# Patient Record
Sex: Male | Born: 1990 | Race: White | Hispanic: No | Marital: Married | Smoking: Never smoker
Health system: Southern US, Community
[De-identification: ages and names within clinical notes are randomized; demographics above are authoritative.]

## PROBLEM LIST (undated history)

## (undated) DIAGNOSIS — R0789 Other chest pain: Secondary | ICD-10-CM

## (undated) DIAGNOSIS — R000 Tachycardia, unspecified: Secondary | ICD-10-CM

## (undated) DIAGNOSIS — I499 Cardiac arrhythmia, unspecified: Secondary | ICD-10-CM

## (undated) DIAGNOSIS — R42 Dizziness and giddiness: Secondary | ICD-10-CM

## (undated) DIAGNOSIS — R079 Chest pain, unspecified: Secondary | ICD-10-CM

## (undated) HISTORY — DX: Chest pain, unspecified: R07.9

## (undated) HISTORY — DX: Tachycardia, unspecified: R00.0

## (undated) HISTORY — DX: Dizziness and giddiness: R42

## (undated) HISTORY — DX: Cardiac arrhythmia, unspecified: I49.9

## (undated) HISTORY — DX: Other chest pain: R07.89

---

## 2015-11-01 ENCOUNTER — Emergency Department (HOSPITAL_COMMUNITY): Payer: 59

## 2015-11-01 ENCOUNTER — Encounter (HOSPITAL_COMMUNITY): Payer: Self-pay | Admitting: Emergency Medicine

## 2015-11-01 ENCOUNTER — Emergency Department (HOSPITAL_COMMUNITY)
Admission: EM | Admit: 2015-11-01 | Discharge: 2015-11-02 | Disposition: A | Discharge status: Home / Self Care | Payer: 59 | Attending: Emergency Medicine | Admitting: Emergency Medicine

## 2015-11-01 DIAGNOSIS — I4891 Unspecified atrial fibrillation: Secondary | ICD-10-CM | POA: Diagnosis not present

## 2015-11-01 DIAGNOSIS — Z7901 Long term (current) use of anticoagulants: Secondary | ICD-10-CM | POA: Insufficient documentation

## 2015-11-01 DIAGNOSIS — R079 Chest pain, unspecified: Secondary | ICD-10-CM | POA: Diagnosis present

## 2015-11-01 HISTORY — PX: CARDIOVERSION: SHX1299

## 2015-11-01 LAB — CBC
HCT: 41.2 % (ref 39.0–52.0)
Hemoglobin: 14.8 g/dL (ref 13.0–17.0)
MCH: 30.4 pg (ref 26.0–34.0)
MCHC: 35.9 g/dL (ref 30.0–36.0)
MCV: 84.6 fL (ref 78.0–100.0)
PLATELETS: 249 10*3/uL (ref 150–400)
RBC: 4.87 MIL/uL (ref 4.22–5.81)
RDW: 11.9 % (ref 11.5–15.5)
WBC: 5.7 10*3/uL (ref 4.0–10.5)

## 2015-11-01 LAB — BASIC METABOLIC PANEL
Anion gap: 7 (ref 5–15)
BUN: 11 mg/dL (ref 6–20)
CHLORIDE: 108 mmol/L (ref 101–111)
CO2: 23 mmol/L (ref 22–32)
CREATININE: 1 mg/dL (ref 0.61–1.24)
Calcium: 9.4 mg/dL (ref 8.9–10.3)
Glucose, Bld: 108 mg/dL — ABNORMAL HIGH (ref 65–99)
POTASSIUM: 4 mmol/L (ref 3.5–5.1)
SODIUM: 138 mmol/L (ref 135–145)

## 2015-11-01 LAB — I-STAT TROPONIN, ED
Troponin i, poc: 0 ng/mL (ref 0.00–0.08)
Troponin i, poc: 0 ng/mL (ref 0.00–0.08)

## 2015-11-01 LAB — T4, FREE: FREE T4: 0.8 ng/dL (ref 0.61–1.12)

## 2015-11-01 LAB — TSH: TSH: 2.966 u[IU]/mL (ref 0.350–4.500)

## 2015-11-01 MED ORDER — DILTIAZEM LOAD VIA INFUSION
20.0000 mg | Freq: Once | INTRAVENOUS | Status: AC
  Administered 2015-11-01: 20 mg via INTRAVENOUS

## 2015-11-01 MED ORDER — SODIUM CHLORIDE 0.9 % IV BOLUS (SEPSIS)
1000.0000 mL | Freq: Once | INTRAVENOUS | Status: AC
  Administered 2015-11-01: 1000 mL via INTRAVENOUS

## 2015-11-01 MED ORDER — PROPOFOL 10 MG/ML IV BOLUS
0.5000 mg/kg | INTRAVENOUS | Status: DC | PRN
  Administered 2015-11-01: 60 mg via INTRAVENOUS

## 2015-11-01 MED ORDER — RIVAROXABAN 20 MG PO TABS: 20.0000 mg | ORAL_TABLET | Freq: Every day | ORAL | Status: DC

## 2015-11-01 MED ORDER — RIVAROXABAN 20 MG PO TABS
20.0000 mg | ORAL_TABLET | Freq: Every day | ORAL | Status: DC
  Administered 2015-11-02: 20 mg via ORAL
  Filled 2015-11-01: qty 1

## 2015-11-01 MED ORDER — DEXTROSE 5 % IV SOLN
5.0000 mg/h | INTRAVENOUS | Status: DC
  Administered 2015-11-01: 5 mg/h via INTRAVENOUS

## 2015-11-01 MED ORDER — PROPOFOL 10 MG/ML IV BOLUS
INTRAVENOUS | Status: AC | PRN
  Administered 2015-11-01: 60 mg via INTRAVENOUS

## 2015-11-01 MED ORDER — PROPOFOL 10 MG/ML IV BOLUS
INTRAVENOUS | Status: AC
  Filled 2015-11-01: qty 20

## 2015-11-01 MED ORDER — DILTIAZEM HCL 100 MG IV SOLR
INTRAVENOUS | Status: AC
  Filled 2015-11-01: qty 100

## 2015-11-01 MED ORDER — PROPOFOL 10 MG/ML IV BOLUS: 0.5000 mg/kg | INTRAVENOUS | Status: DC | PRN

## 2015-11-01 NOTE — ED Notes (Signed)
Patient arrives with complaint of tachycardia, dizziness, chest pain, lightheadedness. HR in triage up to 190. EKG shows A-fib. Denies history. 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 Typical baseline HR in the 60-70s.

## 2015-11-01 NOTE — ED Provider Notes (Signed)
CSN: [id number]     Arrival date & time 11/01/15  1952 History  By signing my name below, I, Tanda Rockers, attest that this documentation has been prepared under the direction and in the presence of Linwood Dibbles, MD. Electronically Signed: Tanda Rockers, ED Scribe. 11/01/2015. 8:36 PM.   Chief Complaint  Patient presents with  . Tachycardia  . Dizziness  . Chest Pain   The history is provided by the patient. No language interpreter was used.    HPI Comments: Scott Booth is a 25 y.o. male who presents to the Emergency Department complaining of sudden onset, constant, waxing and waning, chest heaviness that began earlier tonight. Pt reports that he was sitting on the couch after dinner when he began having the heaviness. He also reports feelings of dizziness and his head "rushing." Pt's girlfriend is a Engineer, civil (consulting) and checked pt's heart rate, reporting skipping beats. Pt has never had symptoms like this in the past. No hx cardiac issues. No energy drinks or caffeine. He did have 1 margarita with dinner and  typically only drinks 1 beer per weekend. FHx cardiac issues. Denies any other associated symptoms.   History reviewed. No pertinent past medical history. History reviewed. No pertinent past surgical history. History reviewed. No pertinent family history. Social History  Substance Use Topics  . Smoking status: Never Smoker   . Smokeless tobacco: None  . Alcohol Use: No    Review of Systems  Cardiovascular: Positive for chest pain.  Neurological: Positive for dizziness and light-headedness.  All other systems reviewed and are negative.  Allergies  Azithromycin and Other  Home Medications   Prior to Admission medications   Medication Sig Start Date End Date Taking? Authorizing Provider  rivaroxaban (XARELTO) 20 MG TABS tablet Take 1 tablet (20 mg total) by mouth daily with supper. 11/01/15   Linwood Dibbles, MD   BP 121/84 mmHg  Pulse 86  Temp(Src) 98.2 F (36.8 C) (Oral)  Resp 16  Wt  115.214 kg  SpO2 100%   Physical Exam  Constitutional: He appears well-developed and well-nourished. No distress.  HENT:  Head: Normocephalic and atraumatic.  Right Ear: External ear normal.  Left Ear: External ear normal.  Eyes: Conjunctivae are normal. Right eye exhibits no discharge. Left eye exhibits no discharge. No scleral icterus.  Neck: Neck supple. No tracheal deviation present.  Cardiovascular: Intact distal pulses.  An irregularly irregular rhythm present. Tachycardia present.   Pulmonary/Chest: Effort normal and breath sounds normal. No stridor. No respiratory distress. He has no wheezes. He has no rales.  Abdominal: Soft. Bowel sounds are normal. He exhibits no distension. There is no tenderness. There is no rebound and no guarding.  Musculoskeletal: He exhibits no edema or tenderness.  Neurological: He is alert. He has normal strength. No cranial nerve deficit (no facial droop, extraocular movements intact, no slurred speech) or sensory deficit. He exhibits normal muscle tone. He displays no seizure activity. Coordination normal.  Skin: Skin is warm and dry. No rash noted.  Psychiatric: He has a normal mood and affect.  Nursing note and vitals reviewed.   ED Course  .Cardioversion Date/Time: 11/01/2015 11:02 PM Performed by: Linwood Dibbles Authorized by: Linwood Dibbles Consent: Verbal consent obtained. Written consent obtained. Risks and benefits: risks, benefits and alternatives were discussed Consent given by: patient Patient sedated: yes Cardioversion basis: elective Pre-procedure rhythm: atrial fibrillation Patient position: patient was placed in a supine position Chest area: chest area exposed Electrodes: pads Electrodes placed: anterior-posterior Number of attempts:  1 Attempt 1 mode: synchronous Attempt 1 waveform: biphasic Attempt 1 shock (in Joules): 150 Attempt 1 outcome: conversion to normal sinus rhythm  .Sedation Date/Time: 11/01/2015 11:02 PM Performed by:  Linwood Dibbles Authorized by: Linwood Dibbles  Consent:    Consent obtained:  Written   Consent given by:  Patient Universal protocol:    Procedure explained and questions answered to patient or proxy's satisfaction: yes     Relevant documents present and verified: yes     Test results available and properly labeled: yes     Imaging studies available: yes     Required blood products, implants, devices, and special equipment available: yes     Site/side marked: yes     Immediately prior to procedure a time out was called: yes   Indications:    Sedation purpose:  Cardioversion   Procedure necessitating sedation performed by:  Physician performing sedation   Intended level of sedation:  Deep Pre-sedation assessment:    Time since last food or drink:  4   ASA classification: class 1 - normal, healthy patient     Neck mobility: normal     Mouth opening:  3 or more finger widths   Thyromental distance:  3 finger widths   Mallampati score:  I - soft palate, uvula, fauces, pillars visible   Pre-sedation assessments completed and reviewed: airway patency, cardiovascular function, hydration status, mental status, nausea/vomiting, pain level, respiratory function and temperature     Pre-sedation assessment completed:  11/01/2015 10:30 PM Immediate pre-procedure details:    Reassessment: Patient reassessed immediately prior to procedure     Reviewed: vital signs, relevant labs/tests and NPO status     Verified: bag valve mask available, emergency equipment available, intubation equipment available, IV patency confirmed, oxygen available, reversal medications available and suction available   Procedure details (see MAR for exact dosages):    Sedation start time:  11/01/2015 10:50 PM Post-procedure details:    Post-sedation assessment completed:  11/01/2015 10:50 PM   Attendance: Constant attendance by certified staff until patient recovered     Post-sedation assessments completed and reviewed: airway  patency, cardiovascular function, hydration status, mental status, nausea/vomiting, pain level, respiratory function and temperature     Patient is stable for discharge or admission: Yes     Patient tolerance:  Tolerated well, no immediate complications  (including critical care time)  DIAGNOSTIC STUDIES: Oxygen Saturation is 98% on RA, normal by my interpretation.    COORDINATION OF CARE: 8:33 PM-Discussed treatment plan which includes BMP, CBC, troponin, TSH, T4, and Troponin with pt at bedside and pt agreed to plan.   Labs Review Labs Reviewed  BASIC METABOLIC PANEL - Abnormal; Notable for the following:    Glucose, Bld 108 (*)    All other components within normal limits  CBC  TSH  T4, FREE  I-STAT TROPOININ, ED  Rosezena Sensor, ED    Imaging Review Dg Chest Port 1 View  11/01/2015  CLINICAL DATA:  Chest heaviness.  Atrial fibrillation. EXAM: PORTABLE CHEST 1 VIEW COMPARISON:  None. FINDINGS: Cardiomediastinal silhouette is normal. Mediastinal contours appear intact. There is no evidence of focal airspace consolidation, pleural effusion or pneumothorax. Low lung volumes causing exaggeration of the interstitial markings. Osseous structures are without acute abnormality. Soft tissues are grossly normal. IMPRESSION: Low lung volumes causing exaggeration of the interstitial markings, otherwise no evidence of radiographically apparent acute cardiopulmonary disease. Electronically Signed   By: Ted Mcalpine M.D.   On: 11/01/2015 20:42   I  have personally reviewed and evaluated these images and lab results as part of my medical decision-making.   EKG Interpretation   Date/Time:  Friday November 01 2015 22:54:37 EDT Ventricular Rate:  80 PR Interval:  168 QRS Duration: 97 QT Interval:  336 QTC Calculation: 387 R Axis:   45 Text Interpretation:  Sinus rhythm ST elevation suggests acute  pericarditis atrial fibrillation resolved since last tracing Confirmed by  Laurine Kuyper  MD-J, Elianna Windom  ([id number]) on 11/01/2015 11:24:44 PM    EKG Interpretation  Date/Time:  Friday November 01 2015 22:54:37 EDT Ventricular Rate:  80 PR Interval:  168 QRS Duration: 97 QT Interval:  336 QTC Calculation: 387 R Axis:   45 Text Interpretation:  Sinus rhythm ST elevation suggests acute pericarditis atrial fibrillation resolved since last tracing Confirmed by Averiana Clouatre  MD-J, Tamina Cyphers ([id number]) on 11/01/2015 11:24:44 PM        MDM   Final diagnoses:  Atrial fibrillation, rapid (HCC)    ChadsVasc =0.  I discussed cardioversion with the patient .  Consulted with Dr Charlestine NightMeans who agrees.  Pt successfully carioverted in the ED.  Will dc home on xarelto.  Dose given in the ED.   Follow up in a fib clinic  I personally performed the services described in this documentation, which was scribed in my presence.  The recorded information has been reviewed and is accurate.      Linwood DibblesJon Milton Streicher, MD 11/01/15 [id number]

## 2015-11-01 NOTE — Significant Event (Signed)
Discussed case with ED attending Lynelle Doctor([place]).  25 year old with acute onset symptoms of chest pain/palpitations tonight, without evidence for prior symptoms to suggest AF prior to tonight. CHADS2VASC 0.  Rates currently controlled, in AFib.   Currently stable, good candidate for Afib protocol.  Agree with electrical cardioversion and if successful, 4 weeks of DOAC if no bleeding risks with follow up in Afib clinic ([id number])

## 2015-11-01 NOTE — Discharge Instructions (Signed)
Atrial Fibrillation °Atrial fibrillation is a type of heartbeat that is irregular or fast (rapid). If you have this condition, your heart keeps quivering in a weird (chaotic) way. This condition can make it so your heart cannot pump blood normally. Having this condition gives a person more risk for stroke, heart failure, and other heart problems. There are different types of atrial fibrillation. Talk with your doctor to learn about the type that you have. °HOME CARE °· Take over-the-counter and prescription medicines only as told by your doctor. °· If your doctor prescribed a blood-thinning medicine, take it exactly as told. Taking too much of it can cause bleeding. If you do not take enough of it, you will not have the protection that you need against stroke and other problems. °· Do not use any tobacco products. These include cigarettes, chewing tobacco, and e-cigarettes. If you need help quitting, ask your doctor. °· If you have apnea (obstructive sleep apnea), manage it as told by your doctor. °· Do not drink alcohol. °· Do not drink beverages that have caffeine. These include coffee, soda, and tea. °· Maintain a healthy weight. Do not use diet pills unless your doctor says they are safe for you. Diet pills may make heart problems worse. °· Follow diet instructions as told by your doctor. °· Exercise regularly as told by your doctor. °· Keep all follow-up visits as told by your doctor. This is important. °GET HELP IF: °· You notice a change in the speed, rhythm, or strength of your heartbeat. °· You are taking a blood-thinning medicine and you notice more bruising. °· You get tired more easily when you move or exercise. °GET HELP RIGHT AWAY IF: °· You have pain in your chest or your belly (abdomen). °· You have sweating or weakness. °· You feel sick to your stomach (nauseous). °· You notice blood in your throw up (vomit), poop (stool), or pee (urine). °· You are short of breath. °· You suddenly have swollen feet  and ankles. °· You feel dizzy. °· Your suddenly get weak or numb in your face, arms, or legs, especially if it happens on one side of your body. °· You have trouble talking, trouble understanding, or both. °· Your face or your eyelid droops on one side. °These symptoms may be an emergency. Do not wait to see if the symptoms will go away. Get medical help right away. Call your local emergency services (911 in the [place] Do not drive yourself to the hospital. °  °This information is not intended to replace advice given to you by your health care provider. Make sure you discuss any questions you have with your health care provider. °  °Document Released: 02/11/2008 Document Revised: 01/23/2015 Document Reviewed: 08/29/2014 °Elsevier Interactive Patient Education ©2016 [employer] ° °

## 2015-11-02 NOTE — ED Notes (Signed)
Patient Alert and oriented X4. Stable and ambulatory. Patient verbalized understanding of the discharge instructions.  Patient belongings were taken by the patient. Patient was sedated and fully back to baseline before discharge

## 2015-11-05 DIAGNOSIS — R079 Chest pain, unspecified: Secondary | ICD-10-CM | POA: Insufficient documentation

## 2015-11-05 DIAGNOSIS — R0789 Other chest pain: Secondary | ICD-10-CM | POA: Insufficient documentation

## 2015-11-05 DIAGNOSIS — R42 Dizziness and giddiness: Secondary | ICD-10-CM

## 2015-11-05 DIAGNOSIS — R000 Tachycardia, unspecified: Secondary | ICD-10-CM

## 2015-11-05 DIAGNOSIS — I309 Acute pericarditis, unspecified: Secondary | ICD-10-CM | POA: Insufficient documentation

## 2015-11-05 DIAGNOSIS — I499 Cardiac arrhythmia, unspecified: Secondary | ICD-10-CM | POA: Insufficient documentation

## 2015-11-05 DIAGNOSIS — I4891 Unspecified atrial fibrillation: Secondary | ICD-10-CM

## 2015-11-15 ENCOUNTER — Encounter: Payer: Self-pay | Admitting: *Deleted

## 2015-11-18 ENCOUNTER — Encounter: Payer: 59 | Admitting: Cardiology

## 2015-11-18 NOTE — Progress Notes (Signed)
This encounter was created in error - please disregard.

## 2015-11-26 NOTE — Progress Notes (Signed)
Electrophysiology Office Note   Date:  11/28/2015   ID:  Scott Booth, DOB 23-Aug-1990, MRN [id number]  PCP:  No PCP Per Patient Primary Electrophysiologist:  Will Jorja LoaMartin Camnitz, MD    Chief Complaint  Patient presents with  . New Patient (Initial Visit)    post hospital  . Atrial Fibrillation     History of Present Illness: Scott Booth is a 25 y.o. male who presents today for electrophysiology evaluation.   Presented to the ER with sudden onset, constant, waxing and waning, chest heaviness on 11/01/15. Pt was sitting on the couch after dinner when he began having the heaviness. He also reports feelings of dizziness and his head "rushing." Pt's girlfriend is a Engineer, civil (consulting)nurse and checked pt's heart rate, reported skipping beats. Pt has never had symptoms like this in the past. No hx cardiac issues. No energy drinks or caffeine. He did have 1 margarita with dinner and typically only drinks 1 beer per weekend. FHx cardiac issues. Denies any other associated symptoms.  Patient was cardioverted in the ER. Since that time, he is felt well without   palpitations.He has been in endurance athlete in the past, swimming distance in college. He has done up to 5K swims in open water.  Today, he denies symptoms of palpitations, chest pain, shortness of breath, orthopnea, PND, lower extremity edema, claudication, dizziness, presyncope, syncope, bleeding, or neurologic sequela. The patient is tolerating medications without difficulties and is otherwise without complaint today.    Past Medical History  Diagnosis Date  . Arrhythmia     AFIB  . Tachycardia   . Dizziness   . Chest pain   . Light headedness   . Chest heaviness   . Acute pericarditis     WITH ATRIAL FIB   Past Surgical History  Procedure Laterality Date  . Cardioversion  11/01/15     Current Outpatient Prescriptions  Medication Sig Dispense Refill  . rivaroxaban (XARELTO) 20 MG TABS tablet Take 1 tablet (20 mg total) by mouth daily  with supper. 30 tablet 0   No current facility-administered medications for this visit.    Allergies:   Azithromycin and Other   Social History:  The patient  reports that he has never smoked. He does not have any smokeless tobacco history on file. He reports that he does not drink alcohol or use illicit drugs.   Family History:  The patient's family history includes Stroke in his maternal grandfather.    ROS:  Please see the history of present illness.   Otherwise, review of systems is positive for none.   All other systems are reviewed and negative.    PHYSICAL EXAM: VS:  BP 124/86 mmHg  Pulse 82  Ht 6\' 4"  (1.93 m)  Wt 259 lb 6.4 oz (117.663 kg)  BMI 31.59 kg/m2 , BMI Body mass index is 31.59 kg/(m^2). GEN: Well nourished, well developed, in no acute distress HEENT: normal Neck: no JVD, carotid bruits, or masses Cardiac: RRR; no murmurs, rubs, or gallops,no edema  Respiratory:  clear to auscultation bilaterally, normal work of breathing GI: soft, nontender, nondistended, + BS MS: no deformity or atrophy Skin: warm and dry Neuro:  Strength and sensation are intact Psych: euthymic mood, full affect  EKG:  EKG is not ordered today.  Recent Labs: 11/01/2015: BUN 11; Creatinine, Ser 1.00; Hemoglobin 14.8; Platelets 249; Potassium 4.0; Sodium 138; TSH 2.966    Lipid Panel  No results found for: CHOL, TRIG, HDL, CHOLHDL, VLDL, LDLCALC, LDLDIRECT  Wt Readings from Last 3 Encounters:  11/27/15 259 lb 6.4 oz (117.663 kg)  11/01/15 254 lb (115.214 kg)      Other studies Reviewed: Additional studies/ records that were reviewed today include: Epic notes   ASSESSMENT AND PLAN:  1.  Atrial fibrillation: Had cardioversion in the ER and was placed on Xarelto.  He has not had recurrence since that time. He will likely be able to come off the Xarelto, but he is too close to the time of his cardioversion. To further evaluate, we'll get an echocardiogram to determine if there is a  cardiac cause for his atrial fibrillation. 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 There is evidence of endurance athletes having a higher incidence of atrial fibrillation.     Current medicines are reviewed at length with the patient today.   The patient does not have concerns regarding his medicines.  The following changes were made today:  none  Labs/ tests ordered today include:  Orders Placed This Encounter  Procedures  . ECHOCARDIOGRAM COMPLETE     Disposition:   FU with Will Camnitz 1 year  Signed, Will Jorja Loa, MD  11/28/2015 9:51 AM     [facility] [address] [place] [address] [phone] (office) [phone] (fax)

## 2015-11-27 ENCOUNTER — Encounter: Payer: Self-pay | Admitting: Cardiology

## 2015-11-27 ENCOUNTER — Encounter (INDEPENDENT_AMBULATORY_CARE_PROVIDER_SITE_OTHER): Payer: Self-pay

## 2015-11-27 ENCOUNTER — Ambulatory Visit (INDEPENDENT_AMBULATORY_CARE_PROVIDER_SITE_OTHER): Payer: 59 | Admitting: Cardiology

## 2015-11-27 VITALS — BP 124/86 | HR 82 | Ht 76.0 in | Wt 259.4 lb

## 2015-11-27 DIAGNOSIS — I480 Paroxysmal atrial fibrillation: Secondary | ICD-10-CM | POA: Diagnosis not present

## 2015-11-27 NOTE — Patient Instructions (Addendum)
Medication Instructions:  Your physician recommends that you continue on your current medications as directed. Please refer to the Current Medication list given to you today.  Labwork: None ordered  Testing/Procedures: Your physician has requested that you have an echocardiogram. Echocardiography is a painless test that uses sound waves to create images of your heart. It provides your doctor with information about the size and shape of your heart and how well your heart's chambers and valves are working. This procedure takes approximately one hour. There are no restrictions for this procedure.  Follow-Up: Your physician wants you to follow-up in: 1 year with Dr. Elberta Fortisamnitz.  You will receive a reminder letter in the mail two months in advance. If you don't receive a letter, please call our office to schedule the follow-up appointment.  If you need a refill on your cardiac medications before your next appointment, please call your pharmacy.  Thank you for choosing [facility]!!   Dory HornSherri Yomira Flitton, RN [phone]

## 2015-12-16 ENCOUNTER — Encounter (INDEPENDENT_AMBULATORY_CARE_PROVIDER_SITE_OTHER): Payer: Self-pay

## 2015-12-16 ENCOUNTER — Other Ambulatory Visit (HOSPITAL_COMMUNITY): Payer: Self-pay

## 2015-12-16 ENCOUNTER — Ambulatory Visit (HOSPITAL_COMMUNITY): Payer: 59 | Attending: Cardiovascular Disease

## 2015-12-16 DIAGNOSIS — I517 Cardiomegaly: Secondary | ICD-10-CM | POA: Insufficient documentation

## 2015-12-16 DIAGNOSIS — I480 Paroxysmal atrial fibrillation: Secondary | ICD-10-CM | POA: Diagnosis not present

## 2015-12-16 DIAGNOSIS — I4891 Unspecified atrial fibrillation: Secondary | ICD-10-CM | POA: Diagnosis present

## 2016-01-07 ENCOUNTER — Telehealth: Payer: Self-pay | Admitting: *Deleted

## 2016-01-07 NOTE — Telephone Encounter (Signed)
Notes Recorded by Sharin GravePamela J Fleming, RN on 12/23/2015 at 4:53 PM EDT Notified pt per Dr Elberta Fortisamnitz ok to stop anticoagulation. He states undesrtanding ------  Notes Recorded by Baird LyonsSherri L Marky Buresh, RN on 12/20/2015 at 4:05 PM EDT Reviewed results with patient who verbalized understanding. Patient explains that he is on a blood thinner and states Dr. Elberta Fortisamnitz advised that he will review echo first and then decide on continuation of blood thinner. [facility] for [facility]. Patient understands we will call him back once reviewed. ------

## 2016-01-07 NOTE — Telephone Encounter (Signed)
Stopping anticoagulation per note below.

## 2018-01-17 ENCOUNTER — Emergency Department
Admission: EM | Admit: 2018-01-17 | Discharge: 2018-01-17 | Disposition: A | Discharge status: Home / Self Care | Payer: 59 | Attending: Emergency Medicine | Admitting: Emergency Medicine

## 2018-01-17 ENCOUNTER — Encounter: Payer: Self-pay | Admitting: Emergency Medicine

## 2018-01-17 ENCOUNTER — Other Ambulatory Visit: Payer: Self-pay

## 2018-01-17 ENCOUNTER — Emergency Department: Payer: 59

## 2018-01-17 DIAGNOSIS — K529 Noninfective gastroenteritis and colitis, unspecified: Secondary | ICD-10-CM

## 2018-01-17 DIAGNOSIS — K6389 Other specified diseases of intestine: Secondary | ICD-10-CM

## 2018-01-17 DIAGNOSIS — R1032 Left lower quadrant pain: Secondary | ICD-10-CM | POA: Diagnosis present

## 2018-01-17 DIAGNOSIS — K659 Peritonitis, unspecified: Secondary | ICD-10-CM | POA: Diagnosis not present

## 2018-01-17 LAB — URINALYSIS, COMPLETE (UACMP) WITH MICROSCOPIC
Bacteria, UA: NONE SEEN
Bilirubin Urine: NEGATIVE
Glucose, UA: NEGATIVE mg/dL
Hgb urine dipstick: NEGATIVE
KETONES UR: NEGATIVE mg/dL
LEUKOCYTES UA: NEGATIVE
Nitrite: NEGATIVE
PH: 5 (ref 5.0–8.0)
Protein, ur: NEGATIVE mg/dL
SPECIFIC GRAVITY, URINE: 1.02 (ref 1.005–1.030)
SQUAMOUS EPITHELIAL / LPF: NONE SEEN (ref 0–5)

## 2018-01-17 LAB — COMPREHENSIVE METABOLIC PANEL
ALBUMIN: 5.1 g/dL — AB (ref 3.5–5.0)
ALT: 45 U/L — AB (ref 0–44)
AST: 25 U/L (ref 15–41)
Alkaline Phosphatase: 55 U/L (ref 38–126)
Anion gap: 6 (ref 5–15)
BUN: 15 mg/dL (ref 6–20)
CHLORIDE: 105 mmol/L (ref 98–111)
CO2: 28 mmol/L (ref 22–32)
CREATININE: 0.99 mg/dL (ref 0.61–1.24)
Calcium: 9.8 mg/dL (ref 8.9–10.3)
GFR calc Af Amer: >60 mL/min (ref >60)
GFR calc non Af Amer: >60 mL/min (ref >60)
GLUCOSE: 96 mg/dL (ref 70–99)
POTASSIUM: 4.6 mmol/L (ref 3.5–5.1)
Sodium: 139 mmol/L (ref 135–145)
Total Bilirubin: 1 mg/dL (ref 0.3–1.2)
Total Protein: 7.3 g/dL (ref 6.5–8.1)

## 2018-01-17 LAB — CBC
HEMATOCRIT: 44.2 % (ref 40.0–52.0)
Hemoglobin: 15.4 g/dL (ref 13.0–18.0)
MCH: 31.5 pg (ref 26.0–34.0)
MCHC: 34.8 g/dL (ref 32.0–36.0)
MCV: 90.6 fL (ref 80.0–100.0)
PLATELETS: 252 10*3/uL (ref 150–440)
RBC: 4.88 MIL/uL (ref 4.40–5.90)
RDW: 12.5 % (ref 11.5–14.5)
WBC: 6.8 10*3/uL (ref 3.8–10.6)

## 2018-01-17 LAB — LIPASE, BLOOD: LIPASE: 24 U/L (ref 11–51)

## 2018-01-17 MED ORDER — OXYCODONE-ACETAMINOPHEN 5-325 MG PO TABS: 1.0000 | ORAL_TABLET | Freq: Two times a day (BID) | ORAL | 0 refills | Status: DC | PRN

## 2018-01-17 MED ORDER — MORPHINE SULFATE (PF) 4 MG/ML IV SOLN
4.0000 mg | Freq: Once | INTRAVENOUS | Status: AC
  Administered 2018-01-17: 4 mg via INTRAVENOUS

## 2018-01-17 MED ORDER — IBUPROFEN 800 MG PO TABS: 800.0000 mg | ORAL_TABLET | Freq: Three times a day (TID) | ORAL | 0 refills | Status: DC | PRN

## 2018-01-17 MED ORDER — IOPAMIDOL (ISOVUE-300) INJECTION 61%
100.0000 mL | Freq: Once | INTRAVENOUS | Status: AC | PRN
  Administered 2018-01-17: 100 mL via INTRAVENOUS

## 2018-01-17 MED ORDER — MORPHINE SULFATE (PF) 4 MG/ML IV SOLN
INTRAVENOUS | Status: AC
  Filled 2018-01-17: qty 1

## 2018-01-17 NOTE — ED Triage Notes (Signed)
Here for LLQ abdominal pain X 2 days. Ambulatory. NAD.  Sent from urgent care.  Denies NVD, fevers.

## 2018-01-17 NOTE — ED Notes (Signed)
Patient transported to CT 

## 2018-01-17 NOTE — ED Notes (Signed)
This RN confirmed patient has ride to go home. Pt verbalized that Wife would be taking him home.

## 2018-01-17 NOTE — ED Provider Notes (Signed)
[facility] Emergency Department Provider Note       Time seen: ----------------------------------------- 12:07 PM on 01/17/2018 -----------------------------------------   I have reviewed the triage vital signs and the nursing notes.  HISTORY   Chief Complaint Abdominal Pain    HPI Scott Booth is a 27 y.o. male with a history of pericarditis and arrhythmia who presents to the ED for left lower quadrant pain for the past 2 days.  Patient was sent from an urgent care due to the degree of tenderness he is having in his left lower quadrant.  Patient states 2 weeks ago he had some dull pain that resolved and then this started again Saturday at its worst.  It was sharp and in the left lower quadrant.  He denies fevers, chills, vomiting or diarrhea.  Past Medical History:  Diagnosis Date  . Acute pericarditis    WITH ATRIAL FIB  . Arrhythmia    AFIB  . Chest heaviness   . Chest pain   . Dizziness   . Light headedness   . Tachycardia     Patient Active Problem List   Diagnosis Date Noted  . Arrhythmia   . Tachycardia   . Dizziness   . Chest pain   . Light headedness   . Chest heaviness   . Acute pericarditis     Past Surgical History:  Procedure Laterality Date  . CARDIOVERSION  11/01/15    Allergies Azithromycin and Other  Social History Social History   Tobacco Use  . Smoking status: Never Smoker  Substance Use Topics  . Alcohol use: No  . Drug use: No   Review of Systems Constitutional: Negative for fever. Cardiovascular: Negative for chest pain. Respiratory: Negative for shortness of breath. Gastrointestinal: Positive for abdominal pain Musculoskeletal: Negative for back pain. Skin: Negative for rash. Neurological: Negative for headaches, focal weakness or numbness.  All systems negative/normal/unremarkable except as stated in the HPI  ____________________________________________   PHYSICAL EXAM:  VITAL SIGNS: ED  Triage Vitals  Enc Vitals Group     BP 01/17/18 1124 111/77     Pulse Rate 01/17/18 1124 72     Resp 01/17/18 1124 18     Temp 01/17/18 1124 98.2 F (36.8 C)     Temp Source 01/17/18 1124 Oral     SpO2 01/17/18 1124 100 %     Weight 01/17/18 1121 260 lb (117.9 kg)     Height 01/17/18 1121 6\' 4"  (1.93 m)     Head Circumference --      Peak Flow --      Pain Score 01/17/18 1121 9     Pain Loc --      Pain Edu? --      Excl. in GC? --    Constitutional: Alert and oriented. Well appearing and in no distress. Eyes: Conjunctivae are normal. Normal extraocular movements. ENT   Head: Normocephalic and atraumatic.   Nose: No congestion/rhinnorhea.   Mouth/Throat: Mucous membranes are moist.   Neck: No stridor. Cardiovascular: Normal rate, regular rhythm. No murmurs, rubs, or gallops. Respiratory: Normal respiratory effort without tachypnea nor retractions. Breath sounds are clear and equal bilaterally. No wheezes/rales/rhonchi. Gastrointestinal: Exquisite left lower quadrant tenderness, no rebound or guarding.  Normal bowel sounds. Musculoskeletal: Nontender with normal range of motion in extremities. No lower extremity tenderness nor edema. Neurologic:  Normal speech and language. No gross focal neurologic deficits are appreciated.  Skin:  Skin is warm, dry and intact. No rash noted. Psychiatric:  Mood and affect are normal. Speech and behavior are normal.  ____________________________________________  ED COURSE:  As part of my medical decision making, I reviewed the following data within the electronic MEDICAL RECORD NUMBER History obtained from family if available, nursing notes, old chart and ekg, as well as notes from prior ED visits. Patient presented for abdominal pain, we will assess with labs and imaging as indicated at this time.   Procedures ____________________________________________   LABS (pertinent positives/negatives)  Labs Reviewed  COMPREHENSIVE METABOLIC  PANEL - Abnormal; Notable for the following components:      Result Value   Albumin 5.1 (*)    ALT 45 (*)    All other components within normal limits  URINALYSIS, COMPLETE (UACMP) WITH MICROSCOPIC - Abnormal; Notable for the following components:   Color, Urine YELLOW (*)    APPearance CLEAR (*)    All other components within normal limits  LIPASE, BLOOD  CBC    RADIOLOGY Images were viewed by me  CT abdomen pelvis with contrast IMPRESSION: 1. Inflammatory changes in the fat adjacent to the proximal sigmoid colon, likely to reflect an area of fatty necrosis from epiploic appendagitis. 2. No other acute findings noted elsewhere in the abdomen or pelvis. Specifically, the appendix is normal.  ____________________________________________  DIFFERENTIAL DIAGNOSIS   Renal colic, UTI, pyelonephritis, muscle strain, diverticulitis, left-sided appendix  FINAL ASSESSMENT AND PLAN  Epiploic appendagitis   Plan: The patient had presented for abdominal pain. Patient's labs did not reveal any acute process. Patient's imaging revealed inflammatory changes on the surface of the sigmoid colon.  There is no focal treatment for this, rather anti-inflammatory medicine.  He is cleared for outpatient follow-up.   Ulice Dash, MD   Note: This note was generated in part or whole with voice recognition software. Voice recognition is usually quite accurate but there are transcription errors that can and very often do occur. I apologize for any typographical errors that were not detected and corrected.     Emily Filbert, MD 01/17/18 1326

## 2018-01-17 NOTE — ED Notes (Signed)
ED Provider at bedside. 

## 2018-01-17 NOTE — ED Notes (Signed)
Patient returned to room. 

## 2018-01-17 NOTE — ED Notes (Signed)
This RN called [facility] on [address] in [place] to cancel patient prescription. This RN spoke with Gunnar Fusi, pharmacist to confirm termination of prescription. RN made MD aware that patient needs prescription [facility] to [facility] in [place] [facility].

## 2018-05-15 ENCOUNTER — Encounter: Payer: Self-pay | Admitting: Emergency Medicine

## 2018-05-15 ENCOUNTER — Emergency Department: Payer: 59

## 2018-05-15 ENCOUNTER — Emergency Department
Admission: EM | Admit: 2018-05-15 | Discharge: 2018-05-15 | Disposition: A | Discharge status: Home / Self Care | Payer: 59 | Attending: Emergency Medicine | Admitting: Emergency Medicine

## 2018-05-15 ENCOUNTER — Other Ambulatory Visit: Payer: Self-pay

## 2018-05-15 DIAGNOSIS — R0789 Other chest pain: Secondary | ICD-10-CM | POA: Diagnosis present

## 2018-05-15 DIAGNOSIS — Z8679 Personal history of other diseases of the circulatory system: Secondary | ICD-10-CM | POA: Insufficient documentation

## 2018-05-15 DIAGNOSIS — R0602 Shortness of breath: Secondary | ICD-10-CM | POA: Insufficient documentation

## 2018-05-15 DIAGNOSIS — R000 Tachycardia, unspecified: Secondary | ICD-10-CM | POA: Insufficient documentation

## 2018-05-15 LAB — CBC
HCT: 43.6 % (ref 39.0–52.0)
HEMOGLOBIN: 15.2 g/dL (ref 13.0–17.0)
MCH: 30.8 pg (ref 26.0–34.0)
MCHC: 34.9 g/dL (ref 30.0–36.0)
MCV: 88.3 fL (ref 80.0–100.0)
PLATELETS: 271 10*3/uL (ref 150–400)
RBC: 4.94 MIL/uL (ref 4.22–5.81)
RDW: 12 % (ref 11.5–15.5)
WBC: 6.6 10*3/uL (ref 4.0–10.5)
nRBC: 0 % (ref 0.0–0.2)

## 2018-05-15 LAB — BASIC METABOLIC PANEL
ANION GAP: 9 (ref 5–15)
BUN: 13 mg/dL (ref 6–20)
CALCIUM: 9.2 mg/dL (ref 8.9–10.3)
CO2: 25 mmol/L (ref 22–32)
Chloride: 105 mmol/L (ref 98–111)
Creatinine, Ser: 1.05 mg/dL (ref 0.61–1.24)
GLUCOSE: 105 mg/dL — AB (ref 70–99)
POTASSIUM: 3.6 mmol/L (ref 3.5–5.1)
SODIUM: 139 mmol/L (ref 135–145)

## 2018-05-15 LAB — TROPONIN I: Troponin I: <0.03 ng/mL (ref <0.03)

## 2018-05-15 LAB — FIBRIN DERIVATIVES D-DIMER (ARMC ONLY): FIBRIN DERIVATIVES D-DIMER (ARMC): 129.11 ng{FEU}/mL (ref 0.00–499.00)

## 2018-05-15 MED ORDER — SODIUM CHLORIDE 0.9 % IV BOLUS
1000.0000 mL | Freq: Once | INTRAVENOUS | Status: AC
  Administered 2018-05-15: 1000 mL via INTRAVENOUS

## 2018-05-15 MED ORDER — KETOROLAC TROMETHAMINE 30 MG/ML IJ SOLN
30.0000 mg | Freq: Once | INTRAMUSCULAR | Status: AC
Start: 2018-05-15 — End: 2018-05-15
  Administered 2018-05-15: 30 mg via INTRAVENOUS
  Filled 2018-05-15: qty 1

## 2018-05-15 MED ORDER — IOHEXOL 350 MG/ML SOLN
75.0000 mL | Freq: Once | INTRAVENOUS | Status: AC | PRN
  Administered 2018-05-15: 75 mL via INTRAVENOUS

## 2018-05-15 MED ORDER — LORAZEPAM 2 MG/ML IJ SOLN
1.0000 mg | Freq: Once | INTRAMUSCULAR | Status: AC
  Administered 2018-05-15: 1 mg via INTRAVENOUS
  Filled 2018-05-15: qty 1

## 2018-05-15 NOTE — Discharge Instructions (Addendum)
Please seek medical attention for any high fevers, chest pain, shortness of breath, change in behavior, persistent vomiting, bloody stool or any other new or concerning symptoms.  

## 2018-05-15 NOTE — ED Provider Notes (Signed)
[facility] Emergency Department Provider Note    ____________________________________________   I have reviewed the triage vital signs and the nursing notes.   HISTORY  Chief Complaint Chest Pain and Shortness of Breath   History limited by: Not Limited   HPI Scott Booth is a 27 y.o. male who presents to the emergency department today because of concerns for chest discomfort and feelings of fluttering.  He states that the symptoms started yesterday afternoon.  He had not done anything exceptionally different during his day.  He denies any unusual activity or exertion.  Denies any trauma to his chest.  He states the symptoms have been fairly constant since then.  He has noticed that he gets a increase in the sensation of fluttering and discomfort when he lies flat.  He also has noticed shortness of breath with this.  He states it is hard for him to walk roughly 10 feet without becoming short of breath.  He denies any associated cough.  He denies any fevers.  He denies any recent illness.  Per medical record review patient has a history of atrial fibrillation  Past Medical History:  Diagnosis Date  . Arrhythmia    AFIB  . Chest heaviness   . Chest pain   . Dizziness   . Light headedness   . Tachycardia     Patient Active Problem List   Diagnosis Date Noted  . Arrhythmia   . Tachycardia   . Dizziness   . Chest pain   . Light headedness   . Chest heaviness   . Acute pericarditis     Past Surgical History:  Procedure Laterality Date  . CARDIOVERSION  11/01/15    Prior to Admission medications   Medication Sig Start Date End Date Taking? Authorizing Provider  ibuprofen (ADVIL,MOTRIN) 800 MG tablet Take 1 tablet (800 mg total) by mouth every 8 (eight) hours as needed. 01/17/18   Emily FilbertWilliams, Jonathan E, MD  oxyCODONE-acetaminophen (PERCOCET) 5-325 MG tablet Take 1 tablet by mouth 2 (two) times daily as needed. 01/17/18   Emily FilbertWilliams, Jonathan E, MD     Allergies Azithromycin and Other  Family History  Problem Relation Age of Onset  . Stroke Maternal Grandfather   . Heart Problems Other        FMH  . Stroke Other     Social History Social History   Tobacco Use  . Smoking status: Never Smoker  . Smokeless tobacco: Never Used  Substance Use Topics  . Alcohol use: Yes    Comment: social  . Drug use: No    Review of Systems Constitutional: No fever/chills Eyes: No visual changes. ENT: No sore throat. Cardiovascular: Positive for chest pressure, palpitations. Respiratory: Positive for shortness of breath. Gastrointestinal: No abdominal pain.  No nausea, no vomiting.  No diarrhea.   Genitourinary: Negative for dysuria. Musculoskeletal: Negative for back pain. Skin: Negative for rash. Neurological: Negative for headaches, focal weakness or numbness.  ____________________________________________   PHYSICAL EXAM:  VITAL SIGNS: ED Triage Vitals  Enc Vitals Group     BP 05/15/18 1851 (!) 144/82     Pulse Rate 05/15/18 1851 (!) 126     Resp 05/15/18 1851 16     Temp 05/15/18 1851 99.2 F (37.3 C)     Temp Source 05/15/18 1851 Oral     SpO2 05/15/18 1851 97 %     Weight 05/15/18 1852 240 lb (108.9 kg)     Height 05/15/18 1852 6\' 4"  (1.93 m)  Head Circumference --      Peak Flow --      Pain Score 05/15/18 1852 4   Constitutional: Alert and oriented.  Eyes: Conjunctivae are normal.  ENT      Head: Normocephalic and atraumatic.      Nose: No congestion/rhinnorhea.      Mouth/Throat: Mucous membranes are moist.      Neck: No stridor. Hematological/Lymphatic/Immunilogical: No cervical lymphadenopathy. Cardiovascular: Tachycardic, regular rhythm.  No murmurs, rubs, or gallops.  Respiratory: Normal respiratory effort without tachypnea nor retractions. Breath sounds are clear and equal bilaterally. No wheezes/rales/rhonchi. Gastrointestinal: Soft and non tender. No rebound. No guarding.  Genitourinary:  Deferred Musculoskeletal: Normal range of motion in all extremities. No lower extremity edema. Neurologic:  Normal speech and language. No gross focal neurologic deficits are appreciated.  Skin:  Skin is warm, dry and intact. No rash noted. Psychiatric: Mood and affect are normal. Speech and behavior are normal. Patient exhibits appropriate insight and judgment.  ____________________________________________    LABS (pertinent positives/negatives)  CBC wbc 6.6, hgb 15.2, plt 271 Trop <0.03 BMP wnl except glu 105 D-dimer 129.11 ____________________________________________   EKG  I, Phineas SemenGraydon Lucette Kratz, attending physician, personally viewed and interpreted this EKG  EKG Time: 1851 Rate: 122 Rhythm: sinus tachycardia Axis: right axis deviation Intervals: qtc 421 QRS: narrow ST changes: no st elevation Impression: abnormal ekg  ____________________________________________    RADIOLOGY  CT angio No PE or other acute finding  CXR No active cardiopulmonary disease  ____________________________________________   PROCEDURES  Procedures  ____________________________________________   INITIAL IMPRESSION / ASSESSMENT AND PLAN / ED COURSE  Pertinent labs & imaging results that were available during my care of the patient were reviewed by me and considered in my medical decision making (see chart for details).   Patient presented to the emergency department because of concern for chest pressure and palpations and fast heart rate that started yesterday. Does have a remote history of afib. EKG here shows sinus tachycardia without any other arrythmia. ddx for sinus tachycardia would include anemia, PE, pericarditis, myocarditis, acs amongst other etiologies. Patient's work up without any concerning findings. D-dimer was negative but family requested CT angio. Discussed radiation risk. The patient was given IV fluids with some improvement in his heart rate. Unclear etiology of the  tachycardia. At this point given negative work up I do think it is reasonable for patient to be discharged to follow up as an outpatient. Discussed importance of follow up with cardiology.  ____________________________________________   FINAL CLINICAL IMPRESSION(S) / ED DIAGNOSES  Final diagnoses:  Sinus tachycardia  Chest pressure  Shortness of breath     Note: This dictation was prepared with Dragon dictation. Any transcriptional errors that result from this process are unintentional     Phineas SemenGoodman, Jayse Hodkinson, MD 05/16/18 1512

## 2018-05-15 NOTE — ED Triage Notes (Signed)
Pt to ED via POV c/o chest pain and shortness of breath. Pt states that chest pain started last night. Pt states that the pain is located in the center of his chest and radiates outward. Pt states that he was sitting on the couch when the pain started. Pt states that there has been constant pain but it increases in intensity at times. Pt states that when he lays down he feels like his heart rate goes up and down. Pt denies N/V.

## 2018-05-20 ENCOUNTER — Ambulatory Visit: Payer: Managed Care, Other (non HMO) | Admitting: Cardiology

## 2018-05-20 NOTE — Progress Notes (Deleted)
Electrophysiology Office Note   Date:  05/20/2018   ID:  Scott Booth, DOB July 06, 1990, MRN [id number]  PCP:  Nonda Lou, MD Primary Electrophysiologist:  Will Jorja Loa, MD    No chief complaint on file.    History of Present Illness: Scott Booth is a 28 y.o. male who presents today for electrophysiology evaluation.   Presented to the ER with sudden onset, constant, waxing and waning, chest heaviness on 11/01/15. Pt was sitting on the couch after dinner when he began having the heaviness. He also reports feelings of dizziness and his head "rushing." Pt's girlfriend is a Engineer, civil (consulting) and checked pt's heart rate, reported skipping beats. Pt has never had symptoms like this in the past. No hx cardiac issues. No energy drinks or caffeine. He did have 1 margarita with dinner and typically only drinks 1 beer per weekend. FHx cardiac issues. Denies any other associated symptoms.  Patient was cardioverted in the ER. Since that time, he is felt well without   palpitations.He has been in endurance athlete in the past, swimming distance in college. He has done up to 5K swims in open water.  Today, denies symptoms of palpitations, chest pain, shortness of breath, orthopnea, PND, lower extremity edema, claudication, dizziness, presyncope, syncope, bleeding, or neurologic sequela. The patient is tolerating medications without difficulties. ***     Past Medical History:  Diagnosis Date  . Arrhythmia    AFIB  . Chest heaviness   . Chest pain   . Dizziness   . Light headedness   . Tachycardia    Past Surgical History:  Procedure Laterality Date  . CARDIOVERSION  11/01/15     Current Outpatient Medications  Medication Sig Dispense Refill  . ibuprofen (ADVIL,MOTRIN) 800 MG tablet Take 1 tablet (800 mg total) by mouth every 8 (eight) hours as needed. 30 tablet 0  . oxyCODONE-acetaminophen (PERCOCET) 5-325 MG tablet Take 1 tablet by mouth 2 (two) times daily as needed. 12 tablet 0    No current facility-administered medications for this visit.     Allergies:   Azithromycin and Other   Social History:  The patient  reports that he has never smoked. He has never used smokeless tobacco. He reports current alcohol use. He reports that he does not use drugs.   Family History:  The patient's family history includes Heart Problems in an other family member; Stroke in his maternal grandfather and another family member.    ROS:  Please see the history of present illness.   Otherwise, review of systems is positive for ***.   All other systems are reviewed and negative.   PHYSICAL EXAM: VS:  There were no vitals taken for this visit. , BMI There is no height or weight on file to calculate BMI. GEN: Well nourished, well developed, in no acute distress  HEENT: normal  Neck: no JVD, carotid bruits, or masses Cardiac: ***RRR; no murmurs, rubs, or gallops,no edema  Respiratory:  clear to auscultation bilaterally, normal work of breathing GI: soft, nontender, nondistended, + BS MS: no deformity or atrophy  Skin: warm and dry Neuro:  Strength and sensation are intact Psych: euthymic mood, full affect  EKG:  EKG {ACTION; IS/IS TDH:[id number]} ordered today. Personal review of the ekg ordered *** shows ***   Recent Labs: 01/17/2018: ALT 45 05/15/2018: BUN 13; Creatinine, Ser 1.05; Hemoglobin 15.2; Platelets 271; Potassium 3.6; Sodium 139    Lipid Panel  No results found for: CHOL, TRIG, HDL, CHOLHDL, VLDL, LDLCALC, LDLDIRECT  Wt Readings from Last 3 Encounters:  05/15/18 240 lb (108.9 kg)  01/17/18 260 lb (117.9 kg)  11/27/15 259 lb 6.4 oz (117.7 kg)      Other studies Reviewed: Additional studies/ records that were reviewed today include: TTE 12/16/2015 - Left ventricle: The cavity size was normal. Wall thickness was   increased in a pattern of mild LVH. Systolic function was normal.   The estimated ejection fraction was in the range of 60% to 65%.   Wall motion was  normal; there were no regional wall motion   abnormalities.   ASSESSMENT AND PLAN:  1.  Atrial fibrillation: ***Had cardioversion in the ER and was placed on Xarelto.  He has not had recurrence since that time. He will likely be able to come off the Xarelto, but he is too close to the time of his cardioversion. To further evaluate, we'll get an echocardiogram to determine if there is a cardiac cause for his atrial fibrillation. 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 There is evidence of endurance athletes having a higher incidence of atrial fibrillation.     Current medicines are reviewed at length with the patient today.   The patient does not have concerns regarding his medicines.  The following changes were made today:  none  Labs/ tests ordered today include:  No orders of the defined types were placed in this encounter.    Disposition:   FU with Will Camnitz *** year  Signed, Will Jorja Loa, MD  05/20/2018 10:03 AM     [facility] [address] [place] [address] [phone] (office) [phone] (fax)

## 2018-05-23 ENCOUNTER — Encounter: Payer: Self-pay | Admitting: Cardiology

## 2019-08-19 ENCOUNTER — Emergency Department: Payer: 59

## 2019-08-19 ENCOUNTER — Encounter: Payer: Self-pay | Admitting: Emergency Medicine

## 2019-08-19 ENCOUNTER — Emergency Department
Admission: EM | Admit: 2019-08-19 | Discharge: 2019-08-20 | Disposition: A | Discharge status: Home / Self Care | Payer: 59 | Attending: Emergency Medicine | Admitting: Emergency Medicine

## 2019-08-19 ENCOUNTER — Other Ambulatory Visit: Payer: Self-pay

## 2019-08-19 DIAGNOSIS — R4789 Other speech disturbances: Secondary | ICD-10-CM | POA: Diagnosis not present

## 2019-08-19 DIAGNOSIS — R251 Tremor, unspecified: Secondary | ICD-10-CM | POA: Diagnosis not present

## 2019-08-19 DIAGNOSIS — R4781 Slurred speech: Secondary | ICD-10-CM | POA: Diagnosis not present

## 2019-08-19 DIAGNOSIS — R404 Transient alteration of awareness: Secondary | ICD-10-CM

## 2019-08-19 DIAGNOSIS — R531 Weakness: Secondary | ICD-10-CM | POA: Diagnosis not present

## 2019-08-19 DIAGNOSIS — R61 Generalized hyperhidrosis: Secondary | ICD-10-CM | POA: Diagnosis not present

## 2019-08-19 DIAGNOSIS — R519 Headache, unspecified: Secondary | ICD-10-CM | POA: Diagnosis not present

## 2019-08-19 DIAGNOSIS — Z79899 Other long term (current) drug therapy: Secondary | ICD-10-CM | POA: Insufficient documentation

## 2019-08-19 DIAGNOSIS — R030 Elevated blood-pressure reading, without diagnosis of hypertension: Secondary | ICD-10-CM | POA: Insufficient documentation

## 2019-08-19 LAB — PROTIME-INR
INR: 0.9 (ref 0.8–1.2)
Prothrombin Time: 12.5 seconds (ref 11.4–15.2)

## 2019-08-19 LAB — DIFFERENTIAL
Abs Immature Granulocytes: 0.02 10*3/uL (ref 0.00–0.07)
Basophils Absolute: 0 10*3/uL (ref 0.0–0.1)
Basophils Relative: 0 %
Eosinophils Absolute: 0 10*3/uL (ref 0.0–0.5)
Eosinophils Relative: 0 %
Immature Granulocytes: 0 %
Lymphocytes Relative: 33 %
Lymphs Abs: 1.9 10*3/uL (ref 0.7–4.0)
Monocytes Absolute: 0.5 10*3/uL (ref 0.1–1.0)
Monocytes Relative: 8 %
Neutro Abs: 3.4 10*3/uL (ref 1.7–7.7)
Neutrophils Relative %: 59 %

## 2019-08-19 LAB — COMPREHENSIVE METABOLIC PANEL
ALT: 68 U/L — ABNORMAL HIGH (ref 0–44)
AST: 27 U/L (ref 15–41)
Albumin: 4.9 g/dL (ref 3.5–5.0)
Alkaline Phosphatase: 56 U/L (ref 38–126)
Anion gap: 9 (ref 5–15)
BUN: 15 mg/dL (ref 6–20)
CO2: 26 mmol/L (ref 22–32)
Calcium: 9.5 mg/dL (ref 8.9–10.3)
Chloride: 105 mmol/L (ref 98–111)
Creatinine, Ser: 1.01 mg/dL (ref 0.61–1.24)
GFR calc Af Amer: >60 mL/min (ref >60)
GFR calc non Af Amer: >60 mL/min (ref >60)
Glucose, Bld: 96 mg/dL (ref 70–99)
Potassium: 3.8 mmol/L (ref 3.5–5.1)
Sodium: 140 mmol/L (ref 135–145)
Total Bilirubin: 0.9 mg/dL (ref 0.3–1.2)
Total Protein: 7.5 g/dL (ref 6.5–8.1)

## 2019-08-19 LAB — CBC
HCT: 43.2 % (ref 39.0–52.0)
Hemoglobin: 15.1 g/dL (ref 13.0–17.0)
MCH: 31.1 pg (ref 26.0–34.0)
MCHC: 35 g/dL (ref 30.0–36.0)
MCV: 88.9 fL (ref 80.0–100.0)
Platelets: 251 10*3/uL (ref 150–400)
RBC: 4.86 MIL/uL (ref 4.22–5.81)
RDW: 11.9 % (ref 11.5–15.5)
WBC: 5.8 10*3/uL (ref 4.0–10.5)
nRBC: 0 % (ref 0.0–0.2)

## 2019-08-19 LAB — T4, FREE: Free T4: 0.78 ng/dL (ref 0.61–1.12)

## 2019-08-19 LAB — GLUCOSE, CAPILLARY: Glucose-Capillary: 85 mg/dL (ref 70–99)

## 2019-08-19 LAB — TROPONIN I (HIGH SENSITIVITY): Troponin I (High Sensitivity): <2 ng/L (ref <18)

## 2019-08-19 LAB — APTT: aPTT: 30 seconds (ref 24–36)

## 2019-08-19 LAB — TSH: TSH: 4.065 u[IU]/mL (ref 0.350–4.500)

## 2019-08-19 MED ORDER — GADOBUTROL 1 MMOL/ML IV SOLN
10.0000 mL | Freq: Once | INTRAVENOUS | Status: AC | PRN
  Administered 2019-08-19: 23:00:00 10 mL via INTRAVENOUS

## 2019-08-19 NOTE — ED Triage Notes (Signed)
Pt presents to ED via POV, pt and wife state that patient began having difficulty speaking and slurred speech on Wednesday, pt reports extreme difficulty finding his words and reports he feels like his fine motor skills have been off making simple tasks such as texting difficult. Pt's wife reports pt has also been hypertensive and pt also reports HA. Pt and wife endorse that symptoms have never completely resolved since starting on 3/31. Upon arrival to ED pt A&O x4, facial symmetry intact, grip strengths equal bilaterally. Pt able to speak in full and complete sentences at this time without difficulty.

## 2019-08-19 NOTE — ED Notes (Signed)
Report given to Rebekah RN.

## 2019-08-19 NOTE — ED Provider Notes (Signed)
[facility] Emergency Department Provider Note  ____________________________________________   First MD Initiated Contact with Patient 08/19/19 2036     (approximate)  I have reviewed the triage vital signs and the nursing notes.   HISTORY  Chief Complaint Aphasia, Headache, and Hypertension    HPI Scott Booth is a 29 y.o. male  With PMHx transient AFib, here with multiple transient episodes of confusion.  History is provided by both the patient and his wife, who are both very pleasant.  His wife is a Engineer, civil (consulting) at [employer].  He reports that over the last several days, he has had recurrent episodes in which she begins to feel somewhat shaky, flushed, with a mild headache.  He states that these episodes first occurred and he had slurred speech with occasional word finding difficulty.  These episodes seem somewhat transient, though the initially lasted several hours.  Of note, the patient has a history of atrial fibrillation in the past and does not take any blood thinners.  He has not had any recent fevers or chills.  No drug use.  No recent medication changes.  These episodes seem to be intermittently occurring more frequently than usual.  Of note, he also reports that he has been hypertensive during these episodes up to the 180s systolic, which is very new for him.  Denies any history of anxiety or recent panic attacks.  No other complaints.       Past Medical History:  Diagnosis Date  . Arrhythmia    AFIB  . Chest heaviness   . Chest pain   . Dizziness   . Light headedness   . Tachycardia     Patient Active Problem List   Diagnosis Date Noted  . Arrhythmia   . Tachycardia   . Dizziness   . Chest pain   . Light headedness   . Chest heaviness   . Acute pericarditis     Past Surgical History:  Procedure Laterality Date  . CARDIOVERSION  11/01/15    Prior to Admission medications   Medication Sig Start Date End Date Taking? Authorizing Provider    ascorbic acid (VITAMIN C) 500 MG tablet Take 500 mg by mouth daily.   Yes [provider]  DRYSOL 20 % external solution Apply 1 application topically. 05/01/19  Yes [provider]  melatonin 5 MG TABS Take 5 mg by mouth at bedtime as needed.   Yes [provider]  aspirin EC 81 MG tablet Take 1 tablet (81 mg total) by mouth daily. 08/20/19 09/19/19  Shaune Pollack, MD    Allergies Azithromycin and Other  Family History  Problem Relation Age of Onset  . Stroke Maternal Grandfather   . Heart Problems Other        FMH  . Stroke Other     Social History Social History   Tobacco Use  . Smoking status: Never Smoker  . Smokeless tobacco: Never Used  Substance Use Topics  . Alcohol use: Yes    Comment: social  . Drug use: No    Review of Systems  Review of Systems  Constitutional: Positive for diaphoresis and fatigue. Negative for chills and fever.  HENT: Negative for sore throat.   Respiratory: Negative for shortness of breath.   Cardiovascular: Negative for chest pain.  Gastrointestinal: Negative for abdominal pain.  Genitourinary: Negative for flank pain.  Musculoskeletal: Negative for neck pain.  Skin: Negative for rash and wound.  Allergic/Immunologic: Negative for immunocompromised state.  Neurological: Positive  for speech difficulty, weakness and headaches. Negative for numbness.  Hematological: Does not bruise/bleed easily.  All other systems reviewed and are negative.    ____________________________________________  PHYSICAL EXAM:      VITAL SIGNS: ED Triage Vitals [08/19/19 1747]  Enc Vitals Group     BP 139/80     Pulse Rate 89     Resp 20     Temp 98.1 F (36.7 C)     Temp Source Oral     SpO2 98 %     Weight 270 lb (122.5 kg)     Height 6\' 4"  (1.93 m)     Head Circumference      Peak Flow      Pain Score 4     Pain Loc      Pain Edu?      Excl. in GC?      Physical Exam Vitals and nursing note reviewed.   Constitutional:      General: He is not in acute distress.    Appearance: He is well-developed.  HENT:     Head: Normocephalic and atraumatic.  Eyes:     Conjunctiva/sclera: Conjunctivae normal.  Cardiovascular:     Rate and Rhythm: Normal rate and regular rhythm.     Heart sounds: Normal heart sounds. No murmur. No friction rub.  Pulmonary:     Effort: Pulmonary effort is normal. No respiratory distress.     Breath sounds: Normal breath sounds. No wheezing or rales.  Abdominal:     General: There is no distension.     Palpations: Abdomen is soft.     Tenderness: There is no abdominal tenderness.  Musculoskeletal:     Cervical back: Neck supple.  Skin:    General: Skin is warm.     Capillary Refill: Capillary refill takes less than 2 seconds.  Neurological:     Mental Status: He is alert and oriented to person, place, and time.     Motor: No abnormal muscle tone.     Comments: Neurological Exam:  Mental Status: Alert and oriented to person, place, and time. Attention and concentration normal. Speech clear. Recent memory is intact. Cranial Nerves: Visual fields grossly intact. EOMI and PERRLA. No nystagmus noted. Facial sensation intact at forehead, maxillary cheek, and chin/mandible bilaterally. No facial asymmetry or weakness. Hearing grossly normal. Uvula is midline, and palate elevates symmetrically. Normal SCM and trapezius strength. Tongue midline without fasciculations. Motor: Muscle strength 5/5 in proximal and distal UE and LE bilaterally. No pronator drift. Muscle tone normal. Sensation: Intact to light touch in upper and lower extremities distally bilaterally.  Gait: Normal without ataxia. Coordination: Normal FTN bilaterally.          ____________________________________________   LABS (all labs ordered are listed, but only abnormal results are displayed)  Labs Reviewed  COMPREHENSIVE METABOLIC PANEL - Abnormal; Notable for the following components:       Result Value   ALT 68 (*)    All other components within normal limits  PROTIME-INR  APTT  CBC  DIFFERENTIAL  GLUCOSE, CAPILLARY  TSH  T4, FREE  CBG MONITORING, ED  TROPONIN I (HIGH SENSITIVITY)  TROPONIN I (HIGH SENSITIVITY)    ____________________________________________  EKG: .  Normal sinus rhythm, ventricular rate 93.  PR 162, QRS 90, QTc 425.  No acute ST elevations or depressions.  T wave inversion noted in lead III. ________________________________________  RADIOLOGY All imaging, including plain films, CT scans, and ultrasounds, independently reviewed by me, and  interpretations confirmed via formal radiology reads.  ED MD interpretation:   CT Head; NAICA MR Brain: Negative  Official radiology report(s): CT HEAD WO CONTRAST  Result Date: 08/19/2019 CLINICAL DATA:  Speech difficulty EXAM: CT HEAD WITHOUT CONTRAST TECHNIQUE: Contiguous axial images were obtained from the base of the skull through the vertex without intravenous contrast. COMPARISON:  None. FINDINGS: Brain: There is no acute intracranial hemorrhage, mass effect, or edema. Gray-white differentiation is preserved. There is no extra-axial fluid collection. Ventricles and sulci are within normal limits in size and configuration. Vascular: No hyperdense vessel or unexpected calcification. Skull: Calvarium is unremarkable. Sinuses/Orbits: No acute finding. Other: None. IMPRESSION: No acute intracranial abnormality. Electronically Signed   By: Macy Mis M.D.   On: 08/19/2019 18:34   MR Brain W and Wo Contrast  Result Date: 08/19/2019 CLINICAL DATA:  Headache with slurred speech EXAM: MRI HEAD WITHOUT AND WITH CONTRAST TECHNIQUE: Multiplanar, multiecho pulse sequences of the brain and surrounding structures were obtained without and with intravenous contrast. CONTRAST:  22mL GADAVIST GADOBUTROL 1 MMOL/ML IV SOLN COMPARISON:  None. FINDINGS: BRAIN: No acute infarct, acute hemorrhage or extra-axial collection. Normal  white matter signal for age. Normal volume of brain parenchyma and CSF spaces. Midline structures are normal. VASCULAR: Major flow voids are preserved. Susceptibility-sensitive sequences show no chronic microhemorrhage or superficial siderosis. SKULL AND UPPER CERVICAL SPINE: Normal calvarium and skull base. Visualized upper cervical spine and soft tissues are normal. SINUSES/ORBITS: No paranasal sinus fluid levels or advanced mucosal thickening. No mastoid or middle ear effusion. Normal orbits. IMPRESSION: Normal brain MRI. Electronically Signed   By: Ulyses Jarred M.D.   On: 08/19/2019 23:31    ____________________________________________  PROCEDURES   Procedure(s) performed (including Critical Care):  Procedures  ____________________________________________  INITIAL IMPRESSION / MDM / [place] / ED COURSE  As part of my medical decision making, I reviewed the following data within the [place] notes reviewed and incorporated, Old chart reviewed, Notes from prior ED visits, and [place] Controlled Substance Database       *Scott Booth was evaluated in Emergency Department on 08/20/2019 for the symptoms described in the history of present illness. He was evaluated in the context of the global COVID-19 pandemic, which necessitated consideration that the patient might be at risk for infection with the SARS-CoV-2 virus that causes COVID-19. Institutional protocols and algorithms that pertain to the evaluation of patients at risk for COVID-19 are in a state of rapid change based on information released by regulatory bodies including the CDC and federal and state organizations. These policies and algorithms were followed during the patient's care in the ED.  Some ED evaluations and interventions may be delayed as a result of limited staffing during the pandemic.*     Medical Decision Making:  Very pleasant 29 yo M here with recurrent episodes of slurred speech,  occasional word-finding difficulty, weakness, and occasional palpitations. Unclear etiology for these episodes. DDx includes recurrent AFib with transient BP changes, possible post-COVID syndrome as his wife did have COVID earlier this year and sx started around that time, subclinical partial seizures, anxiety, or transient HTN urgency. His lab work here is overall very reassuring. TSH normal. No anemia or lyte abnormalities. Given his recurrent word-finding difficulty with known h/o transient AFib, MR obtained. Contrast given for evaluation of MS or mass. Fortunately, MRI negative. He has remained stable in ED with no focal neuro deficits.  Feel it's reasonable to continue his outpt cardiology arrhythmia  work-up, but that he would also benefit from neuro evaluation. Unlikely TIA, though this remains possible. Feel the benefits of low-dose ASA outweigh risks while work-up continues. No apparent other emergent pathology.  ____________________________________________  FINAL CLINICAL IMPRESSION(S) / ED DIAGNOSES  Final diagnoses:  Transient alteration of awareness  Word finding difficulty     MEDICATIONS GIVEN DURING THIS VISIT:  Medications  gadobutrol (GADAVIST) 1 MMOL/ML injection 10 mL (10 mLs Intravenous Contrast Given 08/19/19 2319)     ED Discharge Orders         Ordered    aspirin EC 81 MG tablet  Daily     08/20/19 0029           Note:  This document was prepared using Dragon voice recognition software and may include unintentional dictation errors.   Shaune Pollack, MD 08/20/19 [phone]

## 2019-08-19 NOTE — ED Notes (Signed)
EDP in room  

## 2019-08-19 NOTE — ED Notes (Signed)
Wife at bedside, pt denies pain at present.

## 2019-08-19 NOTE — ED Notes (Signed)
Patient taken to MRI

## 2019-08-20 MED ORDER — ASPIRIN EC 81 MG PO TBEC: 81.0000 mg | DELAYED_RELEASE_TABLET | Freq: Every day | ORAL | 0 refills | Status: AC

## 2019-08-20 NOTE — Discharge Instructions (Signed)
As we discussed, I'd start a low-dose enteric-coated aspirin daily  Keep a journal/record of your blood pressure and ideally, heart rates. Measure in the morning and afternoon at around the same time.  Try to avoid excessive caffeine or alcohol, and ideally try to get 8 hours of sleep nightly  Follow-up with Dr. Juliann Pares as discussed/planned, as well as Dr. Malvin Johns or other Neurologist in the next few weeks

## 2020-04-27 ENCOUNTER — Emergency Department
Admission: EM | Admit: 2020-04-27 | Discharge: 2020-04-27 | Disposition: A | Discharge status: Home / Self Care | Payer: 59 | Attending: Emergency Medicine | Admitting: Emergency Medicine

## 2020-04-27 ENCOUNTER — Other Ambulatory Visit: Payer: Self-pay

## 2020-04-27 ENCOUNTER — Encounter: Payer: Self-pay | Admitting: Emergency Medicine

## 2020-04-27 ENCOUNTER — Emergency Department: Payer: 59

## 2020-04-27 DIAGNOSIS — J1282 Pneumonia due to coronavirus disease 2019: Secondary | ICD-10-CM | POA: Insufficient documentation

## 2020-04-27 DIAGNOSIS — R0602 Shortness of breath: Secondary | ICD-10-CM | POA: Diagnosis present

## 2020-04-27 DIAGNOSIS — U071 COVID-19: Secondary | ICD-10-CM | POA: Diagnosis not present

## 2020-04-27 LAB — TROPONIN I (HIGH SENSITIVITY): Troponin I (High Sensitivity): 10 ng/L (ref <18)

## 2020-04-27 LAB — CBC
HCT: 43.3 % (ref 39.0–52.0)
Hemoglobin: 15.7 g/dL (ref 13.0–17.0)
MCH: 30.9 pg (ref 26.0–34.0)
MCHC: 36.3 g/dL — ABNORMAL HIGH (ref 30.0–36.0)
MCV: 85.2 fL (ref 80.0–100.0)
Platelets: 282 10*3/uL (ref 150–400)
RBC: 5.08 MIL/uL (ref 4.22–5.81)
RDW: 11.3 % — ABNORMAL LOW (ref 11.5–15.5)
WBC: 7.1 10*3/uL (ref 4.0–10.5)
nRBC: 0 % (ref 0.0–0.2)

## 2020-04-27 LAB — BASIC METABOLIC PANEL
Anion gap: 12 (ref 5–15)
BUN: 15 mg/dL (ref 6–20)
CO2: 21 mmol/L — ABNORMAL LOW (ref 22–32)
Calcium: 9 mg/dL (ref 8.9–10.3)
Chloride: 105 mmol/L (ref 98–111)
Creatinine, Ser: 1.08 mg/dL (ref 0.61–1.24)
GFR, Estimated: >60 mL/min (ref >60)
Glucose, Bld: 100 mg/dL — ABNORMAL HIGH (ref 70–99)
Potassium: 3.6 mmol/L (ref 3.5–5.1)
Sodium: 138 mmol/L (ref 135–145)

## 2020-04-27 MED ORDER — FAMOTIDINE 20 MG PO TABS: 20.0000 mg | ORAL_TABLET | Freq: Two times a day (BID) | ORAL | 0 refills | Status: AC

## 2020-04-27 MED ORDER — PREDNISONE 20 MG PO TABS: ORAL_TABLET | ORAL | 0 refills | Status: AC

## 2020-04-27 NOTE — ED Notes (Signed)
See triage note, tested +covid 11 days ago.  States SHOB and chest pressure suddenly with weakness that started today before noon.  States has not had this prior to today with COVID Ambulatory to treatment room, speaking in complete sentences

## 2020-04-27 NOTE — ED Notes (Signed)
Oxygen remains above 98% during ambulation. Pt becomes SOB and tachycardic (HR145) with ambulation.

## 2020-04-27 NOTE — Discharge Instructions (Signed)
Follow-up with your primary care provider if any continued problems.  Return to the emergency department immediately if any shortness of breath or difficulty breathing.  Continue with Tylenol if needed for fever, headache or body aches.  Increase fluids to stay hydrated.  A prescription for Pepcid was sent to your pharmacy along with a prescription for prednisone.

## 2020-04-27 NOTE — ED Triage Notes (Signed)
Pt to ER states was  Diagnosed with COVID on 12/1 and was feeling better and out of quarantine.  States ran out to get some things today and began to feel weak, had a return of fever and is [facility].

## 2020-04-27 NOTE — ED Provider Notes (Signed)
[facility] Emergency Department Provider Note  ____________________________________________   None    (approximate)  I have reviewed the triage vital signs and the nursing notes.   HISTORY  Chief Complaint No chief complaint on file.   HPI Scott Booth is a 29 y.o. male presents to the ED shortness of breath and chest pressure today.  Patient was diagnosed with Covid on 04/17/2020 and this is the first day that he is out of quarantine.  Patient did have the Covid IV treatment and states that for the last 3 to 4 days he has been feeling great and has had no fever or chills.  He states while he was out running some errands he became short of breath.  He denies any other symptoms.  Patient is a non-smoker and has no history of previous asthma or pneumonia.  Currently rates his discomfort as 6 out of 10.     Past Medical History:  Diagnosis Date  . Arrhythmia    AFIB  . Chest heaviness   . Chest pain   . Dizziness   . Light headedness   . Tachycardia     Patient Active Problem List   Diagnosis Date Noted  . Arrhythmia   . Tachycardia   . Dizziness   . Chest pain   . Light headedness   . Chest heaviness   . Acute pericarditis     Past Surgical History:  Procedure Laterality Date  . CARDIOVERSION  11/01/15    Prior to Admission medications   Medication Sig Start Date End Date Taking? Authorizing Provider  ascorbic acid (VITAMIN C) 500 MG tablet Take 500 mg by mouth daily.    [provider]  DRYSOL 20 % external solution Apply 1 application topically. 05/01/19   [provider]  famotidine (PEPCID) 20 MG tablet Take 1 tablet (20 mg total) by mouth 2 (two) times daily. 04/27/20 04/27/21  Bridget Hartshorn L, PA-C  melatonin 5 MG TABS Take 5 mg by mouth at bedtime as needed.    [provider]  predniSONE (DELTASONE) 20 MG tablet Take 2 tablets once a day for 5 days 04/27/20   Tommi Rumps, PA-C     Allergies Azithromycin and Other  Family History  Problem Relation Age of Onset  . Stroke Maternal Grandfather   . Heart Problems Other        FMH  . Stroke Other     Social History Social History   Tobacco Use  . Smoking status: Never Smoker  . Smokeless tobacco: Never Used  Substance Use Topics  . Alcohol use: Yes    Comment: social  . Drug use: No    Review of Systems Constitutional: No fever/chills.  Positive generalized weakness and fatigue. Eyes: No visual changes. ENT: No sore throat. Cardiovascular: Denies chest pain. Respiratory: Positive shortness of breath. Gastrointestinal: No abdominal pain.  No nausea, no vomiting.  No diarrhea.  No constipation. Genitourinary: Negative for dysuria. Musculoskeletal: Negative for back pain. Skin: Negative for rash. Neurological: Negative for headaches, focal weakness or numbness. ____________________________________________   PHYSICAL EXAM:  VITAL SIGNS: ED Triage Vitals  Enc Vitals Group     BP 04/27/20 1411 (!) 167/105     Pulse Rate 04/27/20 1408 70     Resp 04/27/20 1408 (!) 22     Temp 04/27/20 1408 (!) 100.4 F (38 C)     Temp src --      SpO2 04/27/20 1408 97 %  Weight 04/27/20 1415 250 lb (113.4 kg)     Height 04/27/20 1415 6\' 1"  (1.854 m)     Head Circumference --      Peak Flow --      Pain Score 04/27/20 1414 6     Pain Loc --      Pain Edu? --      Excl. in GC? --     Constitutional: Alert and oriented. Well appearing and in no acute distress.  Patient is able to speak in complete sentences and does not appear to be toxic. Eyes: Conjunctivae are normal.  Head: Atraumatic. Nose: No congestion/rhinnorhea. Mouth/Throat: Mucous membranes are moist.  Oropharynx non-erythematous. Neck: No stridor.   Cardiovascular: Normal rate, regular rhythm. Grossly normal heart sounds.  Good peripheral circulation. Respiratory: Normal respiratory effort.  No retractions. Lungs no wheezing or rales was  noted.  Decreased inspiration noted bilaterally. Gastrointestinal: Soft and nontender. No distention. Musculoskeletal: Moves upper and lower extremities without any difficulty.  Normal gait was noted.  No edema noted lower extremities. Neurologic:  Normal speech and language. No gross focal neurologic deficits are appreciated. No gait instability. Skin:  Skin is warm, dry and intact. No rash noted. Psychiatric: Mood and affect are normal. Speech and behavior are normal.  ____________________________________________   LABS (all labs ordered are listed, but only abnormal results are displayed)  Labs Reviewed  BASIC METABOLIC PANEL - Abnormal; Notable for the following components:      Result Value   CO2 21 (*)    Glucose, Bld 100 (*)    All other components within normal limits  CBC - Abnormal; Notable for the following components:   MCHC 36.3 (*)    RDW 11.3 (*)    All other components within normal limits  TROPONIN I (HIGH SENSITIVITY)  TROPONIN I (HIGH SENSITIVITY)   ____________________________________________  EKG EKG was reviewed by doctors on the main ED Sinus tach with ventricular rate of 126. ____________________________________________  RADIOLOGY 14/11/21, personally viewed and evaluated these images (plain radiographs) as part of my medical decision making, as well as reviewing the written report by the radiologist.   Official radiology report(s): DG Chest 2 View  Result Date: 04/27/2020 CLINICAL DATA:  Shortness of breath, chest pain, recently COVID positive 04/17/2020 EXAM: CHEST - 2 VIEW COMPARISON:  05/15/2018 FINDINGS: Minimal patchy and nodular mixed interstitial and airspace opacities in the right lung and in the left lower lobe compatible with multifocal pneumonia related to COVID. Overall low lung volumes. Normal heart size and vascularity. No effusion or pneumothorax. Trachea midline. No acute osseous finding. IMPRESSION: Mild patchy multifocal  pneumonia pattern consistent with COVID pneumonia. Electronically Signed   By: 05/17/2018.  Shick M.D.   On: 04/27/2020 15:14    ____________________________________________   PROCEDURES  Procedure(s) performed (including Critical Care):  Procedures   ____________________________________________   INITIAL IMPRESSION / ASSESSMENT AND PLAN / ED COURSE  As part of my medical decision making, I reviewed the following data within the electronic MEDICAL RECORD NUMBER Notes from prior ED visits and [place] Controlled Substance Database    29 year old male presents to the ED with complaint of shortness of breath that occurred today while he was out running errands.  He was diagnosed with Covid on 04/17/2020 and had been afebrile for the last 3 to 4 days without the use of any over-the-counter medication.  Patient did receive the Covid infusion treatment.  He denies any previous respiratory issues and is a non-smoker.  Initially in triage she had a temperature of 100.4 and an O2 sat of 97%.  Troponin was 10 with normal labs.  Chest x-ray did show mild patchy multifocal  pneumonia pattern consistent with Covid pneumonia.  Patient was able to ambulate while in the ED and O2 sat remained at 99%.  We discussed starting on prednisone for the next 5 days and also because of some mild esophagitis from coughing he was placed on Pepcid.  Patient agrees to stay hydrated with drinking lots of fluids.  Patient works from home but states that yesterday he did have some difficulty with fatigue.  A note for work was written for him due to his pneumonia secondary to Covid.      Clinical Course as of 04/27/20 1815  Sat Apr 27, 2020  1656 Platelets: 282 [RS]    Clinical Course User Index [RS] Tommi Rumps, PA-C     ____________________________________________   FINAL CLINICAL IMPRESSION(S) / ED DIAGNOSES  Final diagnoses:  Pneumonia due to COVID-19 virus     ED Discharge Orders         Ordered    predniSONE  (DELTASONE) 20 MG tablet        04/27/20 1747    famotidine (PEPCID) 20 MG tablet  2 times daily        04/27/20 1747          *Please note:  Scott Booth was evaluated in Emergency Department on 04/27/2020 for the symptoms described in the history of present illness. He was evaluated in the context of the global COVID-19 pandemic, which necessitated consideration that the patient might be at risk for infection with the SARS-CoV-2 virus that causes COVID-19. Institutional protocols and algorithms that pertain to the evaluation of patients at risk for COVID-19 are in a state of rapid change based on information released by regulatory bodies including the CDC and federal and state organizations. These policies and algorithms were followed during the patient's care in the ED.  Some ED evaluations and interventions may be delayed as a result of limited staffing during and the pandemic.*   Note:  This document was prepared using Dragon voice recognition software and may include unintentional dictation errors.    Tommi Rumps, PA-C 04/27/20 1815    Sharman Cheek, MD 04/27/20 [phone]

## 2020-05-01 ENCOUNTER — Other Ambulatory Visit: Payer: Self-pay

## 2020-05-01 ENCOUNTER — Emergency Department: Payer: 59

## 2020-05-01 ENCOUNTER — Encounter: Payer: Self-pay | Admitting: Emergency Medicine

## 2020-05-01 ENCOUNTER — Emergency Department
Admission: EM | Admit: 2020-05-01 | Discharge: 2020-05-02 | Disposition: A | Discharge status: Home / Self Care | Payer: 59 | Attending: Emergency Medicine | Admitting: Emergency Medicine

## 2020-05-01 DIAGNOSIS — R0602 Shortness of breath: Secondary | ICD-10-CM

## 2020-05-01 DIAGNOSIS — I480 Paroxysmal atrial fibrillation: Secondary | ICD-10-CM | POA: Diagnosis not present

## 2020-05-01 DIAGNOSIS — J1282 Pneumonia due to coronavirus disease 2019: Secondary | ICD-10-CM | POA: Insufficient documentation

## 2020-05-01 DIAGNOSIS — U071 COVID-19: Secondary | ICD-10-CM

## 2020-05-01 LAB — CBC
HCT: 44.7 % (ref 39.0–52.0)
Hemoglobin: 15.7 g/dL (ref 13.0–17.0)
MCH: 30.1 pg (ref 26.0–34.0)
MCHC: 35.1 g/dL (ref 30.0–36.0)
MCV: 85.6 fL (ref 80.0–100.0)
Platelets: 489 10*3/uL — ABNORMAL HIGH (ref 150–400)
RBC: 5.22 MIL/uL (ref 4.22–5.81)
RDW: 11.4 % — ABNORMAL LOW (ref 11.5–15.5)
WBC: 7.8 10*3/uL (ref 4.0–10.5)
nRBC: 0 % (ref 0.0–0.2)

## 2020-05-01 LAB — BASIC METABOLIC PANEL
Anion gap: 11 (ref 5–15)
BUN: 11 mg/dL (ref 6–20)
CO2: 22 mmol/L (ref 22–32)
Calcium: 9.2 mg/dL (ref 8.9–10.3)
Chloride: 107 mmol/L (ref 98–111)
Creatinine, Ser: 0.76 mg/dL (ref 0.61–1.24)
GFR, Estimated: >60 mL/min (ref >60)
Glucose, Bld: 124 mg/dL — ABNORMAL HIGH (ref 70–99)
Potassium: 3.7 mmol/L (ref 3.5–5.1)
Sodium: 140 mmol/L (ref 135–145)

## 2020-05-01 LAB — FIBRIN DERIVATIVES D-DIMER (ARMC ONLY): Fibrin derivatives D-dimer (ARMC): 307.03 ng/mL (FEU) (ref 0.00–499.00)

## 2020-05-01 LAB — TROPONIN I (HIGH SENSITIVITY)
Troponin I (High Sensitivity): 2 ng/L (ref <18)
Troponin I (High Sensitivity): 2 ng/L (ref <18)

## 2020-05-01 MED ORDER — LACTATED RINGERS IV BOLUS
1000.0000 mL | Freq: Once | INTRAVENOUS | Status: AC
  Administered 2020-05-02: 1000 mL via INTRAVENOUS

## 2020-05-01 NOTE — ED Triage Notes (Signed)
Pt to ED from home c/o SOB and chest pain.  States was dx COVID on the 1st and then seen here on the 11th and dx COVID pneumonia.  States has been weak, shaky at home, was napping and with pulse ox had O2 drop to 88%.  Pt has been on ABX given by PCP at [facility].  Pt with productive cough.  Pt A&Ox4, chest rise even and unlabored while sitting but states worse while lying, skin WNL.

## 2020-05-01 NOTE — ED Provider Notes (Signed)
[facility] Emergency Department Provider Note   ____________________________________________   Event Date/Time   First MD Initiated Contact with Patient 05/01/20 2309     (approximate)  I have reviewed the triage vital signs and the nursing notes.   HISTORY  Chief Complaint Shortness of Breath and Chest Pain    HPI Scott Booth is a 29 y.o. male with past medical history of paroxysmal atrial fibrillation who presents to the ED complaining of shortness of breath and chest pain.  Patient reports that he initially tested positive for COVID-19 on 12/1, subsequently diagnosed with Covid related pneumonia 4 days ago.  He was prescribed antibiotics by his PCP and steroids by the ED, but states his symptoms have not improved over the course of the past 4 days.  He complains of pressure in his chest when he takes a deep breath, dyspnea on exertion, and feeling like his heart is racing with any movement.  He has not had any fevers, cough, vomiting, diarrhea, abdominal pain, pain or swelling in his legs.  He has not been vaccinated against COVID-19.          Past Medical History:  Diagnosis Date  . Arrhythmia    AFIB  . Chest heaviness   . Chest pain   . Dizziness   . Light headedness   . Tachycardia     Patient Active Problem List   Diagnosis Date Noted  . Arrhythmia   . Tachycardia   . Dizziness   . Chest pain   . Light headedness   . Chest heaviness   . Acute pericarditis     Past Surgical History:  Procedure Laterality Date  . CARDIOVERSION  11/01/15    Prior to Admission medications   Medication Sig Start Date End Date Taking? Authorizing Provider  albuterol (VENTOLIN HFA) 108 (90 Base) MCG/ACT inhaler Inhale 2 puffs into the lungs every 6 (six) hours as needed for wheezing or shortness of breath. 05/02/20   Chesley Noon, MD  ascorbic acid (VITAMIN C) 500 MG tablet Take 500 mg by mouth daily.    [provider]  DRYSOL 20 %  external solution Apply 1 application topically. 05/01/19   [provider]  famotidine (PEPCID) 20 MG tablet Take 1 tablet (20 mg total) by mouth 2 (two) times daily. 04/27/20 04/27/21  Bridget Hartshorn L, PA-C  melatonin 5 MG TABS Take 5 mg by mouth at bedtime as needed.    [provider]  predniSONE (DELTASONE) 20 MG tablet Take 2 tablets once a day for 5 days 04/27/20   Tommi Rumps, PA-C    Allergies Azithromycin and Other  Family History  Problem Relation Age of Onset  . Stroke Maternal Grandfather   . Heart Problems Other        [facility]  . Stroke Other     Social History Social History   Tobacco Use  . Smoking status: Never Smoker  . Smokeless tobacco: Never Used  Substance Use Topics  . Alcohol use: Yes    Comment: social  . Drug use: No    Review of Systems  Constitutional: No fever/chills Eyes: No visual changes. ENT: No sore throat. Cardiovascular: Positive for chest pain.  Positive for lightheadedness. Respiratory: Positive for shortness of breath. Gastrointestinal: No abdominal pain.  No nausea, no vomiting.  No diarrhea.  No constipation. Genitourinary: Negative for dysuria. Musculoskeletal: Negative for back pain. Skin: Negative for rash. Neurological: Negative for headaches, focal weakness or numbness.  ____________________________________________   PHYSICAL EXAM:  VITAL SIGNS: ED Triage Vitals  Enc Vitals Group     BP 05/01/20 1827 137/80     Pulse Rate 05/01/20 1827 95     Resp 05/01/20 1827 20     Temp 05/01/20 1827 98.7 F (37.1 C)     Temp Source 05/01/20 1827 Oral     SpO2 05/01/20 1827 100 %     Weight 05/01/20 1827 250 lb (113.4 kg)     Height 05/01/20 1827 6\' 4"  (1.93 m)     Head Circumference --      Peak Flow --      Pain Score 05/01/20 1928 3     Pain Loc --      Pain Edu? --      Excl. in [place]? --     Constitutional: Alert and oriented. Eyes: Conjunctivae are normal. Head: Atraumatic. Nose: No  congestion/rhinnorhea. Mouth/Throat: Mucous membranes are moist. Neck: Normal ROM Cardiovascular: Normal rate, regular rhythm. Grossly normal heart sounds.  2+ radial pulses bilaterally. Respiratory: Normal respiratory effort.  No retractions. Lungs CTAB. Gastrointestinal: Soft and nontender. No distention. Genitourinary: deferred Musculoskeletal: No lower extremity tenderness nor edema. Neurologic:  Normal speech and language. No gross focal neurologic deficits are appreciated. Skin:  Skin is warm, dry and intact. No rash noted. Psychiatric: Mood and affect are normal. Speech and behavior are normal.  ____________________________________________   LABS (all labs ordered are listed, but only abnormal results are displayed)  Labs Reviewed  BASIC METABOLIC PANEL - Abnormal; Notable for the following components:      Result Value   Glucose, Bld 124 (*)    All other components within normal limits  CBC - Abnormal; Notable for the following components:   RDW 11.4 (*)    Platelets 489 (*)    All other components within normal limits  FIBRIN DERIVATIVES D-DIMER ([facility] ONLY)  TROPONIN I (HIGH SENSITIVITY)  TROPONIN I (HIGH SENSITIVITY)   ____________________________________________  EKG  ED ECG REPORT I, 05/03/20, the attending physician, personally viewed and interpreted this ECG.   Date: 05/01/2020  EKG Time: 18:23  Rate: 106  Rhythm: sinus tachycardia  Axis: RAD  Intervals:none  ST&T Change: None   PROCEDURES  Procedure(s) performed (including Critical Care):  Procedures   ____________________________________________   INITIAL IMPRESSION / ASSESSMENT AND PLAN / ED COURSE       29 year old male with past medical history of paroxysmal atrial fibrillation, not currently on anticoagulation, who presents to the ED for ongoing shortness of breath, lightheadedness, palpitations, and chest pain following diagnosis of COVID-19 greater than 2 weeks ago.  EKG shows  sinus tachycardia with no ischemic changes, initial troponin is negative and I have a low suspicion for ACS.  Chest x-ray reviewed by me and shows improving infiltrates from previous.  Remainder of labs are unremarkable at this time.  Patient would be at higher risk for PE given his recent diagnosis of COVID-19, we will further assess with CTA chest.  If remainder of work-up is negative, patient may have lingering symptoms related to COVID-19.  CTA is negative for PE but does redemonstrate findings of COVID-19 pneumonia.  Patient continues to maintain O2 sats on room air and he is appropriate for discharge home.  Will provide prescription for an inhaler and he was counseled to follow-up with his PCP, otherwise return to the ED for new or worsening symptoms.  Patient agrees with plan.      ____________________________________________   FINAL  CLINICAL IMPRESSION(S) / ED DIAGNOSES  Final diagnoses:  Pneumonia due to COVID-19 virus  Shortness of breath     ED Discharge Orders         Ordered    albuterol (VENTOLIN HFA) 108 (90 Base) MCG/ACT inhaler  Every 6 hours PRN       Note to Pharmacy: Please supply with spacer   05/02/20 0039           Note:  This document was prepared using Dragon voice recognition software and may include unintentional dictation errors.   Chesley Noon, MD 05/02/20 0040

## 2020-05-01 NOTE — ED Notes (Signed)
Pt awake alert and oriented x4.  Reports worsening intermittent cp with associated sob and prod cough.  Pt also reported that O2 sats had been dropping at home; lowest noted at home was 88% -- Pt now mid 90s on RA; lung sounds diminished to posterior bases upon auscultation.  BP slightly elevated without h/o HTN.  Abdomen soft, nontender - no n/v reported.  Skin warm dry and intact.  Will monitor for acute changes and maintain plan of care as pt awaits most recent lab results (D-dimer and repeat trop) - call bell in reach.

## 2020-05-02 ENCOUNTER — Emergency Department: Payer: 59

## 2020-05-02 MED ORDER — IOHEXOL 350 MG/ML SOLN
100.0000 mL | Freq: Once | INTRAVENOUS | Status: AC | PRN
  Administered 2020-05-02: 100 mL via INTRAVENOUS

## 2020-05-02 MED ORDER — ALBUTEROL SULFATE HFA 108 (90 BASE) MCG/ACT IN AERS: 2.0000 | INHALATION_SPRAY | Freq: Four times a day (QID) | RESPIRATORY_TRACT | 0 refills | Status: AC | PRN

## 2020-05-02 NOTE — ED Notes (Signed)
Patient transported to CT via stretcher escorted by CT tech   

## 2020-05-02 NOTE — ED Notes (Signed)
Pt has returned from CT dept via stretcher- no acute changes noted  

## 2020-05-02 NOTE — ED Notes (Signed)
Patient discharged to home per MD order. Patient in stable condition, and deemed medically cleared by ED provider for discharge. Discharge instructions reviewed with patient/family using "Teach Back"; verbalized understanding of medication education and administration, and information about follow-up care. Denies further concerns. ° °

## 2021-05-14 ENCOUNTER — Emergency Department
Admission: EM | Admit: 2021-05-14 | Discharge: 2021-05-14 | Disposition: A | Discharge status: Home / Self Care | Payer: 59 | Attending: Emergency Medicine | Admitting: Emergency Medicine

## 2021-05-14 ENCOUNTER — Other Ambulatory Visit: Payer: Self-pay

## 2021-05-14 ENCOUNTER — Encounter: Payer: Self-pay | Admitting: Emergency Medicine

## 2021-05-14 DIAGNOSIS — R002 Palpitations: Secondary | ICD-10-CM | POA: Diagnosis present

## 2021-05-14 DIAGNOSIS — I4891 Unspecified atrial fibrillation: Secondary | ICD-10-CM | POA: Insufficient documentation

## 2021-05-14 LAB — CBC WITH DIFFERENTIAL/PLATELET
Abs Immature Granulocytes: 0.03 10*3/uL (ref 0.00–0.07)
Basophils Absolute: 0 10*3/uL (ref 0.0–0.1)
Basophils Relative: 0 %
Eosinophils Absolute: 0 10*3/uL (ref 0.0–0.5)
Eosinophils Relative: 0 %
HCT: 47.4 % (ref 39.0–52.0)
Hemoglobin: 16.4 g/dL (ref 13.0–17.0)
Immature Granulocytes: 0 %
Lymphocytes Relative: 24 %
Lymphs Abs: 2.2 10*3/uL (ref 0.7–4.0)
MCH: 30.8 pg (ref 26.0–34.0)
MCHC: 34.6 g/dL (ref 30.0–36.0)
MCV: 88.9 fL (ref 80.0–100.0)
Monocytes Absolute: 0.8 10*3/uL (ref 0.1–1.0)
Monocytes Relative: 8 %
Neutro Abs: 6.2 10*3/uL (ref 1.7–7.7)
Neutrophils Relative %: 68 %
Platelets: 300 10*3/uL (ref 150–400)
RBC: 5.33 MIL/uL (ref 4.22–5.81)
RDW: 12 % (ref 11.5–15.5)
WBC: 9.3 10*3/uL (ref 4.0–10.5)
nRBC: 0 % (ref 0.0–0.2)

## 2021-05-14 LAB — BASIC METABOLIC PANEL
Anion gap: 9 (ref 5–15)
BUN: 16 mg/dL (ref 6–20)
CO2: 25 mmol/L (ref 22–32)
Calcium: 9.3 mg/dL (ref 8.9–10.3)
Chloride: 105 mmol/L (ref 98–111)
Creatinine, Ser: 1.04 mg/dL (ref 0.61–1.24)
GFR, Estimated: >60 mL/min (ref >60)
Glucose, Bld: 108 mg/dL — ABNORMAL HIGH (ref 70–99)
Potassium: 3.8 mmol/L (ref 3.5–5.1)
Sodium: 139 mmol/L (ref 135–145)

## 2021-05-14 LAB — MAGNESIUM: Magnesium: 2 mg/dL (ref 1.7–2.4)

## 2021-05-14 LAB — TROPONIN I (HIGH SENSITIVITY): Troponin I (High Sensitivity): 6 ng/L (ref <18)

## 2021-05-14 MED ORDER — METOPROLOL TARTRATE 5 MG/5ML IV SOLN
5.0000 mg | INTRAVENOUS | Status: DC | PRN
  Administered 2021-05-14: 12:00:00 5 mg via INTRAVENOUS
  Filled 2021-05-14: qty 5

## 2021-05-14 MED ORDER — METOPROLOL TARTRATE 25 MG PO TABS: 25.0000 mg | ORAL_TABLET | Freq: Two times a day (BID) | ORAL | 1 refills | Status: AC

## 2021-05-14 MED ORDER — PROPOFOL 10 MG/ML IV BOLUS
1.0000 mg/kg | Freq: Once | INTRAVENOUS | Status: AC
  Administered 2021-05-14: 13:00:00 113.4 mg via INTRAVENOUS
  Filled 2021-05-14: qty 20

## 2021-05-14 MED ORDER — SODIUM CHLORIDE 0.9 % IV BOLUS
1000.0000 mL | Freq: Once | INTRAVENOUS | Status: AC
  Administered 2021-05-14: 14:00:00 1000 mL via INTRAVENOUS

## 2021-05-14 MED ORDER — OXYCODONE-ACETAMINOPHEN 5-325 MG PO TABS
1.0000 | ORAL_TABLET | Freq: Once | ORAL | Status: AC
  Administered 2021-05-14: 12:00:00 1 via ORAL
  Filled 2021-05-14: qty 1

## 2021-05-14 NOTE — ED Notes (Signed)
Pt to ED from dr Roslynn Amble office for a fib. States he was cardioverted in 2017 for afib rvr. Reports some dizziness and chest discomfort. HR 100-115 at this time. NAD noted. Alert and oriented

## 2021-05-14 NOTE — ED Provider Notes (Signed)
[facility] Emergency Department Provider Note   ____________________________________________   Event Date/Time   First MD Initiated Contact with Patient 05/14/21 1137     (approximate)  I have reviewed the triage vital signs and the nursing notes.   HISTORY  Chief Complaint Chest Pain and Irregular Heart Beat    HPI Scott Booth is a 30 y.o. male with past medical history of paroxysmal atrial fibrillation who presents to the ED complaining of palpitations.  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  He states this occurred shortly after he took tramadol for pain related to his surgery.  He has felt palpitations persistently since then which is associated with dizziness and lightheadedness.  He states he has felt like he is going to pass out, will occasionally have a "thump" of pain in his chest but denies any pain in his chest currently.  He has not had any cough or difficulty breathing, denies any swelling in his legs.  He dealt with atrial fibrillation on 1 prior occasion 5 years ago, underwent cardioversion at that time and has not had any recurrence.  He does not take any medications for anticoagulation or rate control.        Past Medical History:  Diagnosis Date   Arrhythmia    AFIB   Chest heaviness    Chest pain    Dizziness    Light headedness    Tachycardia     Patient Active Problem List   Diagnosis Date Noted   Arrhythmia    Tachycardia    Dizziness    Chest pain    Light headedness    Chest heaviness    Acute pericarditis     Past Surgical History:  Procedure Laterality Date   CARDIOVERSION  11/01/15    Prior to Admission medications   Medication Sig Start Date End Date Taking? Authorizing Provider  acetaminophen (TYLENOL) 325 MG tablet Take 2 tablets by  mouth every 4 (four) hours as needed. 12/29/20  Yes [provider]  ascorbic acid (VITAMIN C) 500 MG tablet Take 500 mg by mouth daily.   Yes [provider]  cyclobenzaprine (FLEXERIL) 10 MG tablet Take 10 mg by mouth 3 (three) times daily as needed for muscle spasms.   Yes [provider]  Melatonin 10 MG TABS Take 1 tablet by mouth at bedtime as needed.   Yes [provider]  metoprolol tartrate (LOPRESSOR) 25 MG tablet Take 1 tablet (25 mg total) by mouth 2 (two) times daily. 05/14/21 07/13/21 Yes Blake Divine, MD  omeprazole (PRILOSEC) 20 MG capsule Take 20 mg by mouth daily. 03/28/21  Yes [provider]  traMADol (ULTRAM) 50 MG tablet Take 50 mg by mouth every 6 (six) hours as needed. 05/13/21  Yes [provider]  albuterol (VENTOLIN HFA) 108 (90 Base) MCG/ACT inhaler Inhale 2 puffs into the lungs every 6 (six) hours as needed for wheezing or shortness of breath. Patient not taking: Reported on 05/14/2021 05/02/20   Blake Divine, MD  DRYSOL 20 % external solution Apply 1 application topically. Patient not taking: Reported on 05/14/2021 05/01/19   [provider]  famotidine (PEPCID) 20 MG tablet Take 1 tablet (20 mg total) by mouth 2 (two) times daily. 04/27/20 04/27/21  Johnn Hai, PA-C  ibuprofen (ADVIL) 600 MG tablet Take 600  mg by mouth every 6 (six) hours as needed. Patient not taking: Reported on 05/14/2021 05/13/21   [provider]  predniSONE (DELTASONE) 20 MG tablet Take 2 tablets once a day for 5 days Patient not taking: Reported on 05/14/2021 04/27/20   Johnn Hai, PA-C    Allergies Azithromycin and Other  Family History  Problem Relation Age of Onset   Stroke Maternal Grandfather    Heart Problems Other        [facility]   Stroke Other     Social History Social History   Tobacco Use   Smoking status: Never   Smokeless tobacco: Never  Substance Use Topics   Alcohol use: Yes     Comment: social   Drug use: No    Review of Systems  Constitutional: No fever/chills Eyes: No visual changes. ENT: No sore throat. Cardiovascular: Denies chest pain.  Positive for palpitations and lightheadedness. Respiratory: Denies shortness of breath. Gastrointestinal: No abdominal pain.  No nausea, no vomiting.  No diarrhea.  No constipation. Genitourinary: Negative for dysuria. Musculoskeletal: Negative for back pain. Skin: Negative for rash. Neurological: Negative for headaches, focal weakness or numbness.  ____________________________________________   PHYSICAL EXAM:  VITAL SIGNS: ED Triage Vitals  Enc Vitals Group     BP 05/14/21 1055 125/88     Pulse Rate 05/14/21 1055 90     Resp 05/14/21 1055 18     Temp 05/14/21 1055 98.1 F (36.7 C)     Temp Source 05/14/21 1055 Oral     SpO2 05/14/21 1055 98 %     Weight 05/14/21 1048 250 lb (113.4 kg)     Height 05/14/21 1048 6\' 4"  (1.93 m)     Head Circumference --      Peak Flow --      Pain Score --      Pain Loc --      Pain Edu? --      Excl. in [place]? --     Constitutional: Alert and oriented. Eyes: Conjunctivae are normal. Head: Atraumatic. Nose: No congestion/rhinnorhea. Mouth/Throat: Mucous membranes are moist. Neck: Normal ROM Cardiovascular: Tachycardic, irregularly irregular rhythm. Grossly normal heart sounds.  2+ radial pulses bilaterally. Respiratory: Normal respiratory effort.  No retractions. Lungs CTAB. Gastrointestinal: Soft and nontender. No distention. Genitourinary: deferred Musculoskeletal: No lower extremity tenderness nor edema. Neurologic:  Normal speech and language. No gross focal neurologic deficits are appreciated. Skin:  Skin is warm, dry and intact. No rash noted. Psychiatric: Mood and affect are normal. Speech and behavior are normal.  ____________________________________________   LABS (all labs ordered are listed, but only abnormal results are displayed)  Labs Reviewed   BASIC METABOLIC PANEL - Abnormal; Notable for the following components:      Result Value   Glucose, Bld 108 (*)    All other components within normal limits  CBC WITH DIFFERENTIAL/PLATELET  MAGNESIUM  TROPONIN I (HIGH SENSITIVITY)   ____________________________________________  EKG  ED ECG REPORT I, Blake Divine, the attending physician, personally viewed and interpreted this ECG.   Date: 05/14/2021  EKG Time: 10:54  Rate: 139  Rhythm: atrial fibrillation  Axis: Normal  Intervals:none  ST&T Change: None   PROCEDURES  Procedure(s) performed (including Critical Care):  .Sedation  Date/Time: 05/14/2021 1:37 PM Performed by: Blake Divine, MD Authorized by: Blake Divine, MD   Consent:    Consent obtained:  Verbal   Consent given by:  Patient   Risks discussed:  Allergic reaction, dysrhythmia, inadequate sedation, nausea,  vomiting, respiratory compromise necessitating ventilatory assistance and intubation, prolonged sedation necessitating reversal and prolonged hypoxia resulting in organ damage   Alternatives discussed:  Analgesia without sedation Universal protocol:    Immediately prior to procedure, a time out was called: yes     Patient identity confirmed:  Arm band and verbally with patient Indications:    Procedure performed:  Cardioversion   Procedure necessitating sedation performed by:  Physician performing sedation Pre-sedation assessment:    Time since last food or drink:  6   ASA classification: class 1 - normal, healthy patient     Mouth opening:  2 finger widths   Thyromental distance:  3 finger widths   Mallampati score:  II - soft palate, uvula, fauces visible   Neck mobility: normal     Pre-sedation assessments completed and reviewed: airway patency, cardiovascular function, hydration status, mental status, nausea/vomiting, pain level, respiratory function and temperature     Pre-sedation assessment completed:  05/14/2021 1:05 PM Immediate  pre-procedure details:    Reassessment: Patient reassessed immediately prior to procedure     Reviewed: vital signs, relevant labs/tests and NPO status     Verified: bag valve mask available, emergency equipment available, intubation equipment available, IV patency confirmed, oxygen available, reversal medications available and suction available   Procedure details (see MAR for exact dosages):    Preoxygenation:  Nasal cannula   Sedation:  Propofol   Intended level of sedation: deep   Analgesia:  None   Intra-procedure monitoring:  Blood pressure monitoring, cardiac monitor, continuous pulse oximetry, continuous capnometry, frequent LOC assessments and frequent vital sign checks   Intra-procedure events: none     Total Provider sedation time (minutes):  4 Post-procedure details:    Post-sedation assessment completed:  05/14/2021 1:35 PM   Attendance: Constant attendance by certified staff until patient recovered     Recovery: Patient returned to pre-procedure baseline     Post-sedation assessments completed and reviewed: airway patency, cardiovascular function, hydration status, mental status, nausea/vomiting, pain level, respiratory function and temperature     Patient is stable for discharge or admission: yes     Procedure completion:  Tolerated well, no immediate complications .Cardioversion  Date/Time: 05/14/2021 2:30 PM Performed by: Chesley Noon, MD Authorized by: Chesley Noon, MD   Consent:    Consent obtained:  Verbal   Consent given by:  Patient   Risks discussed:  Cutaneous burn, death, induced arrhythmia and pain   Alternatives discussed:  Rate-control medication and referral Pre-procedure details:    Cardioversion basis:  Elective   Rhythm:  Atrial fibrillation   Electrode placement:  Anterior-lateral Patient sedated: Yes. Refer to sedation procedure documentation for details of sedation.  Attempt one:    Cardioversion mode:  Synchronous   Waveform:  Biphasic    Shock (Joules):  200   Shock outcome:  Conversion to normal sinus rhythm Post-procedure details:    Patient status:  Alert   Patient tolerance of procedure:  Tolerated well, no immediate complications .Critical Care Performed by: Chesley Noon, MD Authorized by: Chesley Noon, MD   Critical care provider statement:    Critical care time (minutes):  30   Critical care time was exclusive of:  Separately billable procedures and treating other patients and teaching time   Critical care was necessary to treat or prevent imminent or life-threatening deterioration of the following conditions:  Cardiac failure   Critical care was time spent personally by me on the following activities:  Development of treatment plan with  patient or surrogate, discussions with consultants, evaluation of patient's response to treatment, examination of patient, ordering and review of laboratory studies, ordering and review of radiographic studies, ordering and performing treatments and interventions, pulse oximetry, re-evaluation of patient's condition and review of old charts   I assumed direction of critical care for this patient from another provider in my specialty: no     ____________________________________________   INITIAL IMPRESSION / ASSESSMENT AND PLAN / ED COURSE      30 year old male with past medical history of paroxysmal atrial fibrillation who presents to the ED complaining of palpitations and dizziness since last night with occasional "thump" of pain in his chest.  EKG is consistent with atrial fibrillation with RVR, no ischemic changes noted.  We will attempt to control heart rate with IV metoprolol, check labs for electrolyte abnormality or anemia.  Patient denies significant caffeine intake or drug use that could be contributing to atrial fibrillation.  Rate is improved following dose of IV metoprolol, labs are unremarkable with no significant electrolyte abnormality.  Despite this, patient  continues to be significantly symptomatic with dizziness and lightheadedness.  Case discussed with Dr. Nehemiah Massed of cardiology.  Who recommends proceeding with elective cardioversion.  Patient was sedated with propofol and cardioverted on the first attempt back to normal sinus rhythm.  He tolerated sedation without difficulty, is now awake and alert with resolution of his prior dizziness and lightheadedness.  He continues to be in normal sinus rhythm and is appropriate for discharge home with outpatient cardiology follow-up.  Cardiology recommends starting patient on 25 mg of metoprolol twice daily and he was counseled to check his blood pressure in the morning and evening before taking this medication.  His wife is an ICU nurse and will be able to monitor his blood pressure and heart rate regularly.  He was counseled to return to the ED for new worsening symptoms, patient agrees with plan.      ____________________________________________   FINAL CLINICAL IMPRESSION(S) / ED DIAGNOSES  Final diagnoses:  Atrial fibrillation with RVR [facility])     ED Discharge Orders          Ordered    metoprolol tartrate (LOPRESSOR) 25 MG tablet  2 times daily        05/14/21 1425             Note:  This document was prepared using Dragon voice recognition software and may include unintentional dictation errors.    Blake Divine, MD 05/14/21 [phone]

## 2021-05-14 NOTE — ED Notes (Signed)
Pt more alert and talking. Pt answering questions. Pt updated on plan of care at this time.

## 2021-05-14 NOTE — ED Notes (Signed)
ETCO on, ambul bag at bedside, 2L [place] on pt. Meds verified with MD. Pads on pt and monitor is turned to Defib and sync.  IV flushed in patent.

## 2021-05-14 NOTE — ED Triage Notes (Signed)
Brought over from Dr [facility] office with chest apin and irregular heart rate

## 2021-05-14 NOTE — ED Notes (Signed)
Pt speaking to MD at this time discussing what ablation is. Pt states little pain.

## 2021-05-14 NOTE — ED Notes (Addendum)
Charge pushed and shock placed. Pt is now back in rhythm. Pt still little arousable. EKG performed at this time.

## 2021-05-14 NOTE — ED Notes (Addendum)
Propofol med given at this time by MD [place], 37mL given IV push flushed after with saline. Pt not responsive to voice.

## 2021-12-08 IMAGING — CT CT HEAD W/O CM
3 series · 16 of 47 positions shown, 19 images · non-contrast
Comparison: None.

CLINICAL DATA: Speech difficulty

EXAM:
CT HEAD WITHOUT CONTRAST
TECHNIQUE: Contiguous axial images were obtained from the base of the skull
through the vertex without intravenous contrast.

[Series 3: head wo · axial · 0.45mm/px · z∈[+633,+763]mm · 10 of 32 slices shown, 13 images]
[im 3/32  brain]
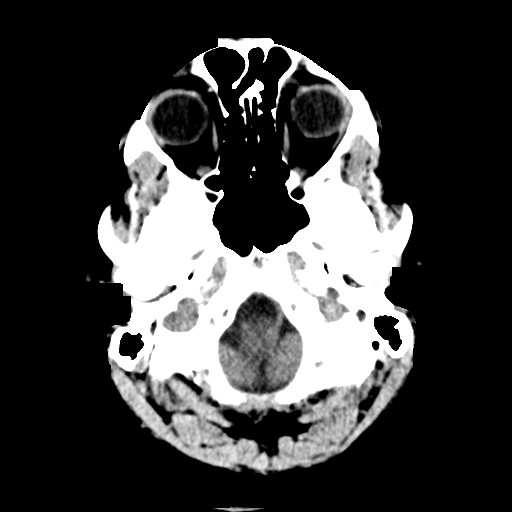
[im 3/32  bone]
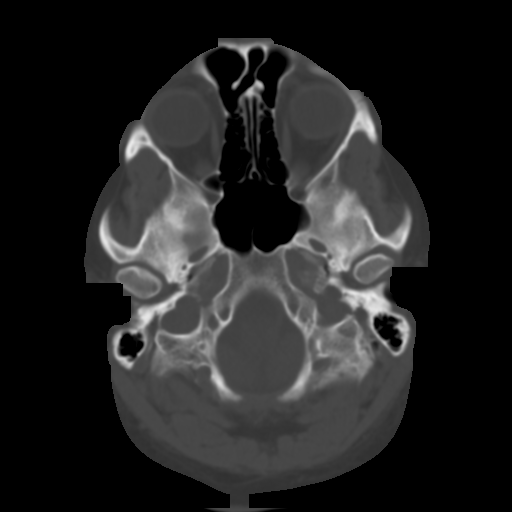
[im 6/32  brain]
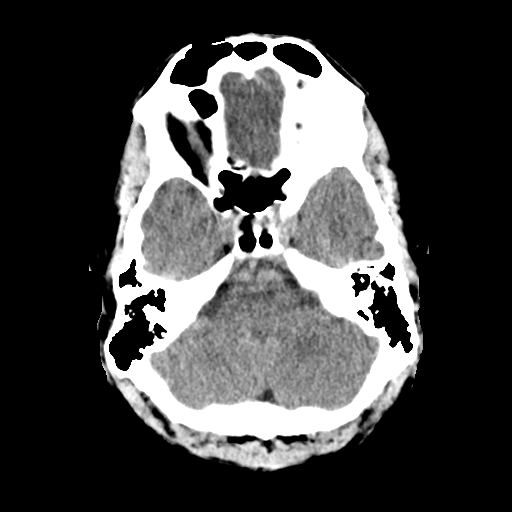
[im 9/32  brain]
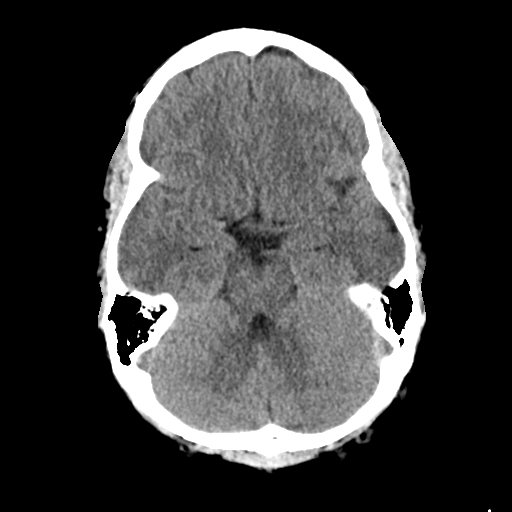
[im 11/32  brain]
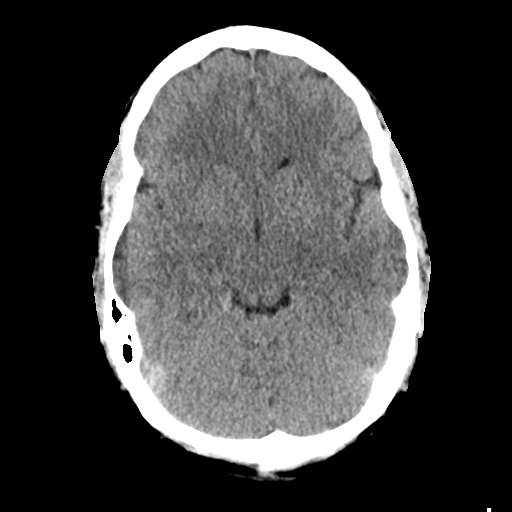
[im 14/32  brain]
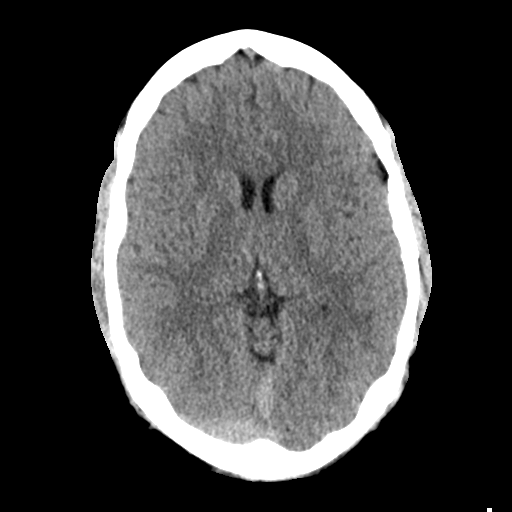
[im 14/32  bone]
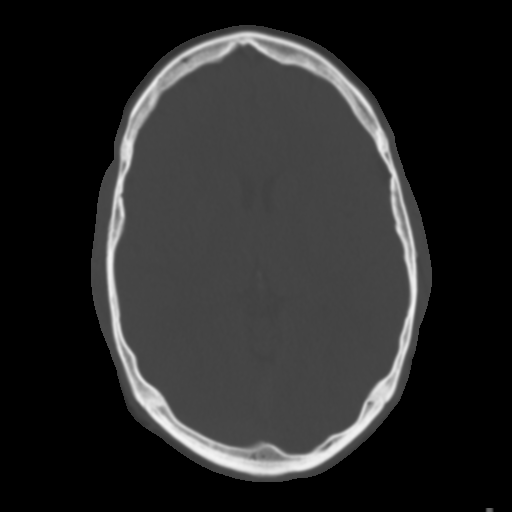
[im 18/32  brain]
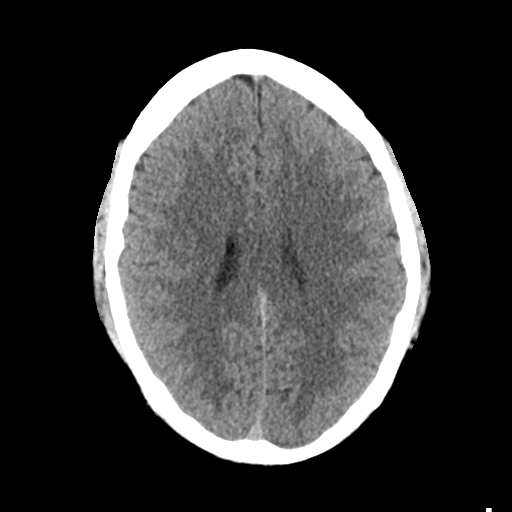
[im 21/32  brain]
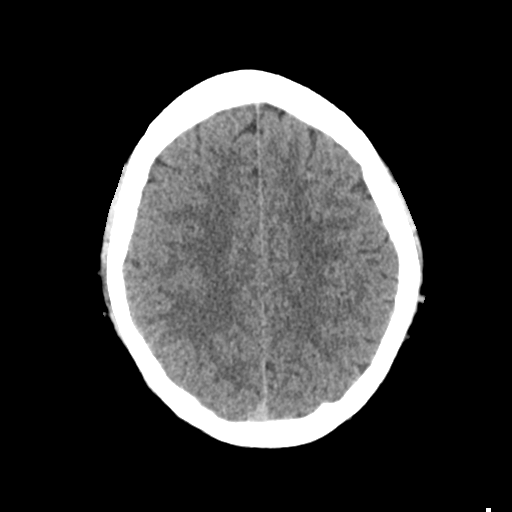
[im 24/32  brain]
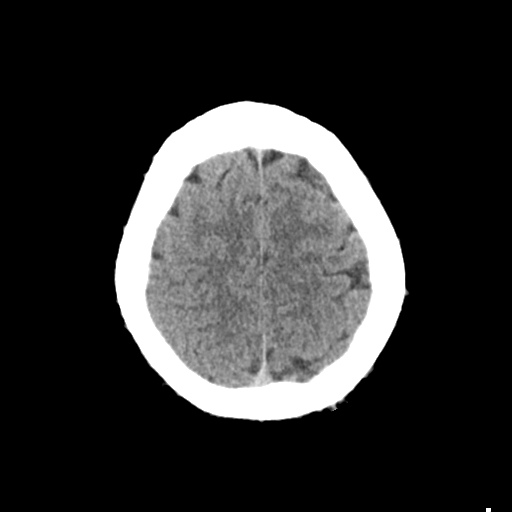
[im 26/32  brain]
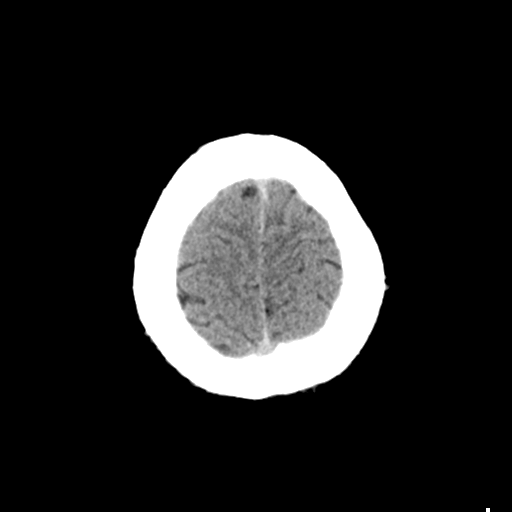
[im 26/32  bone]
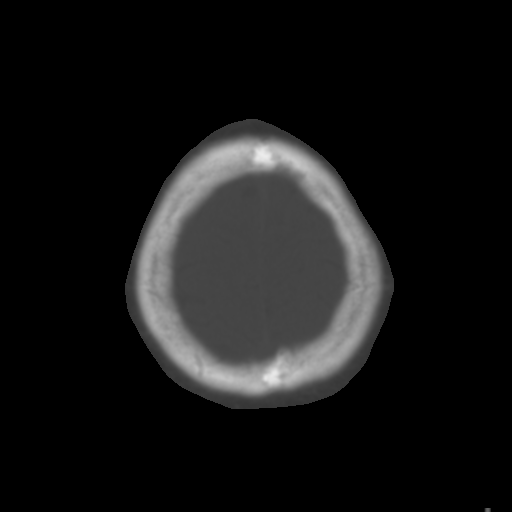
[im 29/32  brain]
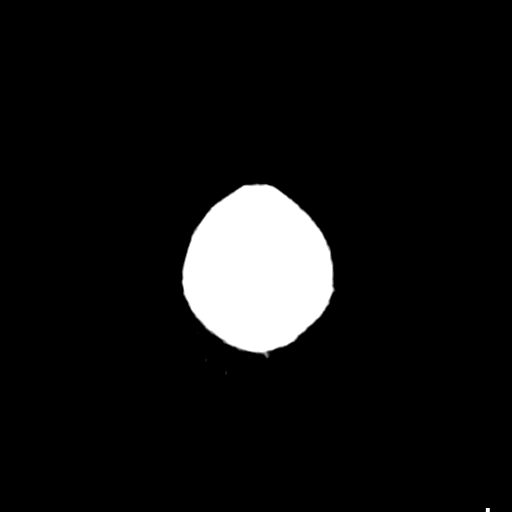

[Series 4: coronal soft tissue · coronal · 0.31mm/px · 3 of 71 slices shown]
[im 24/71  brain]
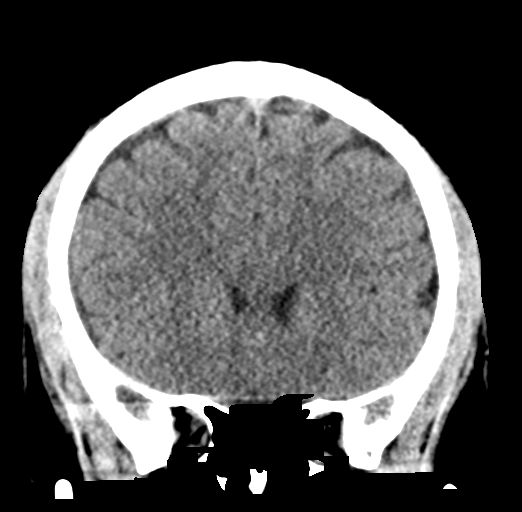
[im 32/71  brain]
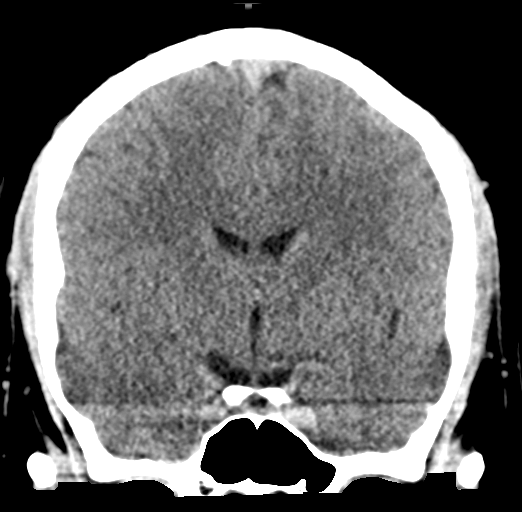
[im 39/71  brain]
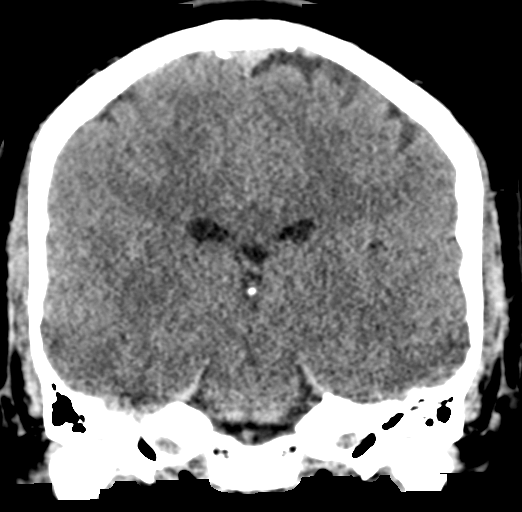

[Series 5: sagittal soft tissue · sagittal · 0.31mm/px · 3 of 54 slices shown]
[im 18/54  brain]
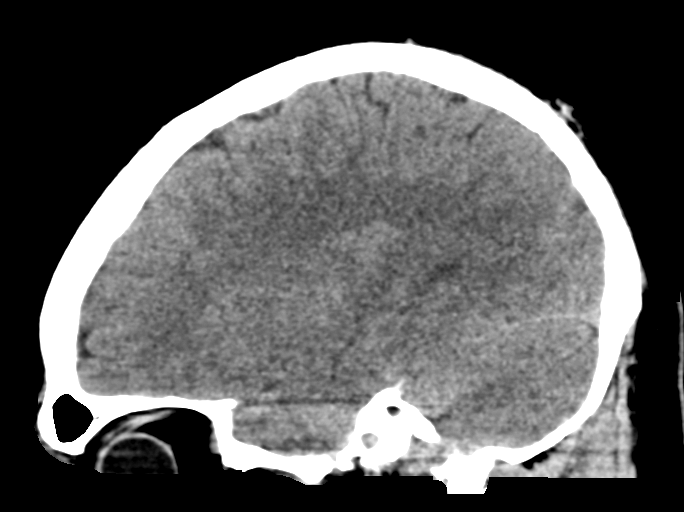
[im 27/54  brain]
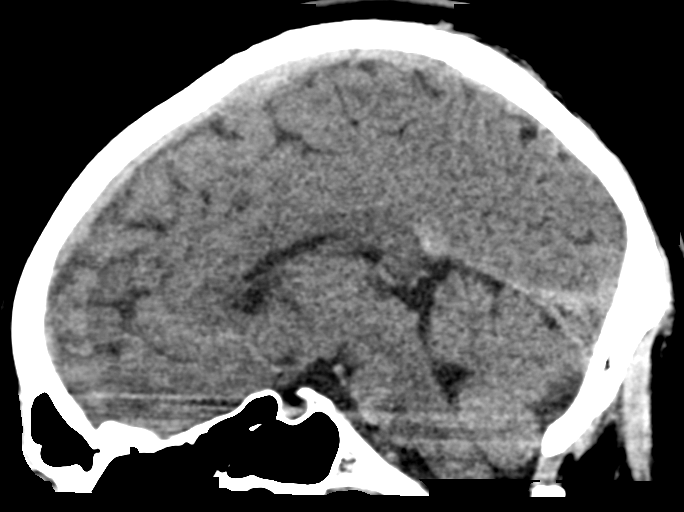
[im 36/54  brain]
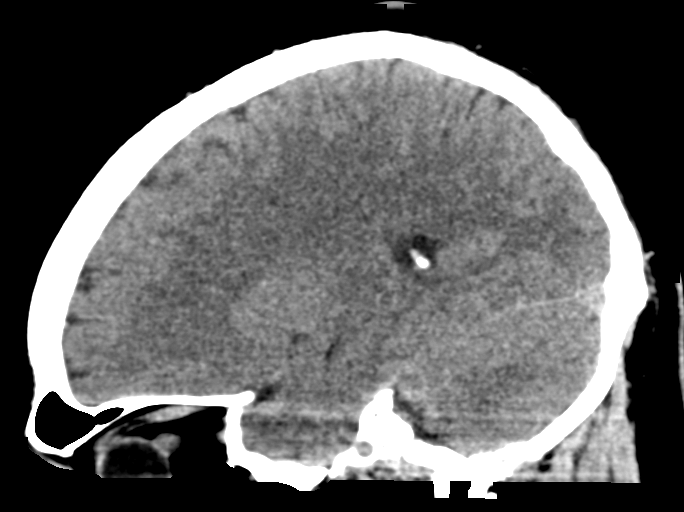

[16 of 47 positions shown; findings below may reference images not displayed]

FINDINGS: Brain: There is no acute intracranial hemorrhage, mass effect, or
edema. Gray-white differentiation is preserved. There is no
extra-axial fluid collection. Ventricles and sulci are within normal
limits in size and configuration.

Vascular: No hyperdense vessel or unexpected calcification.

Skull: Calvarium is unremarkable.

Sinuses/Orbits: No acute finding.

Other: None.
IMPRESSION: No acute intracranial abnormality.
